# Patient Record
Sex: Female | Born: 1955
Health system: Southern US, Community
[De-identification: ages and names within clinical notes are randomized; demographics above are authoritative.]

## PROBLEM LIST (undated history)

## (undated) DIAGNOSIS — Z9889 Other specified postprocedural states: Secondary | ICD-10-CM

## (undated) DIAGNOSIS — G5600 Carpal tunnel syndrome, unspecified upper limb: Secondary | ICD-10-CM

## (undated) DIAGNOSIS — M1990 Unspecified osteoarthritis, unspecified site: Secondary | ICD-10-CM

## (undated) DIAGNOSIS — F32A Depression, unspecified: Secondary | ICD-10-CM

## (undated) DIAGNOSIS — F419 Anxiety disorder, unspecified: Secondary | ICD-10-CM

## (undated) DIAGNOSIS — R112 Nausea with vomiting, unspecified: Secondary | ICD-10-CM

## (undated) DIAGNOSIS — F329 Major depressive disorder, single episode, unspecified: Secondary | ICD-10-CM

## (undated) DIAGNOSIS — D649 Anemia, unspecified: Secondary | ICD-10-CM

## (undated) DIAGNOSIS — N189 Chronic kidney disease, unspecified: Secondary | ICD-10-CM

## (undated) DIAGNOSIS — N3281 Overactive bladder: Secondary | ICD-10-CM

## (undated) DIAGNOSIS — R05 Cough: Secondary | ICD-10-CM

## (undated) DIAGNOSIS — R053 Chronic cough: Secondary | ICD-10-CM

## (undated) HISTORY — DX: Anxiety disorder, unspecified: F41.9

## (undated) HISTORY — DX: Unspecified osteoarthritis, unspecified site: M19.90

## (undated) HISTORY — PX: OTHER SURGICAL HISTORY: SHX169

## (undated) HISTORY — DX: Cough: R05

## (undated) HISTORY — DX: Carpal tunnel syndrome, unspecified upper limb: G56.00

## (undated) HISTORY — DX: Overactive bladder: N32.81

## (undated) HISTORY — PX: JOINT REPLACEMENT: SHX530

## (undated) HISTORY — DX: Chronic cough: R05.3

---

## 1998-11-30 ENCOUNTER — Encounter: Admission: RE | Admit: 1998-11-30 | Discharge: 1999-01-25 | Payer: Self-pay | Admitting: Family Medicine

## 1999-02-16 ENCOUNTER — Encounter: Payer: Self-pay | Admitting: Neurosurgery

## 1999-02-16 ENCOUNTER — Ambulatory Visit (HOSPITAL_COMMUNITY): Admission: RE | Admit: 1999-02-16 | Discharge: 1999-02-16 | Payer: Self-pay | Admitting: Neurosurgery

## 1999-06-17 ENCOUNTER — Ambulatory Visit (HOSPITAL_COMMUNITY): Admission: RE | Admit: 1999-06-17 | Discharge: 1999-06-17 | Payer: Self-pay | Admitting: Family Medicine

## 1999-06-17 ENCOUNTER — Encounter: Payer: Self-pay | Admitting: Family Medicine

## 2000-01-30 ENCOUNTER — Encounter: Admission: RE | Admit: 2000-01-30 | Discharge: 2000-01-30 | Payer: Self-pay | Admitting: Obstetrics and Gynecology

## 2000-01-30 ENCOUNTER — Encounter: Payer: Self-pay | Admitting: Obstetrics and Gynecology

## 2001-01-31 ENCOUNTER — Encounter: Payer: Self-pay | Admitting: Obstetrics and Gynecology

## 2001-01-31 ENCOUNTER — Encounter: Admission: RE | Admit: 2001-01-31 | Discharge: 2001-01-31 | Payer: Self-pay | Admitting: Obstetrics and Gynecology

## 2002-02-17 ENCOUNTER — Encounter: Payer: Self-pay | Admitting: Emergency Medicine

## 2002-02-17 ENCOUNTER — Emergency Department (HOSPITAL_COMMUNITY): Admission: EM | Admit: 2002-02-17 | Discharge: 2002-02-17 | Payer: Self-pay | Admitting: Emergency Medicine

## 2002-03-28 ENCOUNTER — Encounter: Payer: Self-pay | Admitting: Obstetrics and Gynecology

## 2002-03-28 ENCOUNTER — Encounter: Admission: RE | Admit: 2002-03-28 | Discharge: 2002-03-28 | Payer: Self-pay | Admitting: Obstetrics and Gynecology

## 2006-01-23 ENCOUNTER — Emergency Department (HOSPITAL_COMMUNITY): Admission: EM | Admit: 2006-01-23 | Discharge: 2006-01-23 | Payer: Self-pay | Admitting: Family Medicine

## 2006-11-26 ENCOUNTER — Encounter: Admission: RE | Admit: 2006-11-26 | Discharge: 2006-11-26 | Payer: Self-pay | Admitting: Surgery

## 2007-05-08 ENCOUNTER — Encounter: Admission: RE | Admit: 2007-05-08 | Discharge: 2007-05-08 | Payer: Self-pay | Admitting: Surgery

## 2007-11-13 ENCOUNTER — Encounter: Admission: RE | Admit: 2007-11-13 | Discharge: 2007-11-13 | Payer: Self-pay | Admitting: Obstetrics and Gynecology

## 2008-11-13 ENCOUNTER — Encounter: Admission: RE | Admit: 2008-11-13 | Discharge: 2008-11-13 | Payer: Self-pay | Admitting: Anesthesiology

## 2009-12-01 ENCOUNTER — Emergency Department (HOSPITAL_COMMUNITY): Admission: EM | Admit: 2009-12-01 | Discharge: 2009-12-01 | Payer: Self-pay | Admitting: Family Medicine

## 2010-01-16 ENCOUNTER — Emergency Department (HOSPITAL_COMMUNITY): Admission: EM | Admit: 2010-01-16 | Discharge: 2010-01-16 | Payer: Self-pay | Admitting: Family Medicine

## 2010-01-24 ENCOUNTER — Encounter: Admission: RE | Admit: 2010-01-24 | Discharge: 2010-01-24 | Payer: Self-pay | Admitting: Family Medicine

## 2010-02-02 ENCOUNTER — Encounter: Admission: RE | Admit: 2010-02-02 | Discharge: 2010-02-02 | Payer: Self-pay | Admitting: Family Medicine

## 2010-12-18 ENCOUNTER — Encounter: Payer: Self-pay | Admitting: Family Medicine

## 2011-01-27 ENCOUNTER — Other Ambulatory Visit: Payer: Self-pay | Admitting: Obstetrics and Gynecology

## 2011-01-27 DIAGNOSIS — Z1231 Encounter for screening mammogram for malignant neoplasm of breast: Secondary | ICD-10-CM

## 2011-02-08 ENCOUNTER — Ambulatory Visit
Admission: RE | Admit: 2011-02-08 | Discharge: 2011-02-08 | Disposition: A | Discharge status: Home / Self Care | Payer: Federal, State, Local not specified - PPO | Source: Ambulatory Visit | Attending: Obstetrics and Gynecology | Admitting: Obstetrics and Gynecology

## 2011-02-08 DIAGNOSIS — Z1231 Encounter for screening mammogram for malignant neoplasm of breast: Secondary | ICD-10-CM

## 2011-02-16 ENCOUNTER — Other Ambulatory Visit: Payer: Self-pay | Admitting: Specialist

## 2011-02-16 ENCOUNTER — Encounter (HOSPITAL_COMMUNITY): Payer: Federal, State, Local not specified - PPO | Attending: Specialist

## 2011-02-16 DIAGNOSIS — Z79899 Other long term (current) drug therapy: Secondary | ICD-10-CM | POA: Insufficient documentation

## 2011-02-16 DIAGNOSIS — Z01812 Encounter for preprocedural laboratory examination: Secondary | ICD-10-CM | POA: Insufficient documentation

## 2011-02-16 LAB — URINALYSIS, ROUTINE W REFLEX MICROSCOPIC: Ketones, ur: NEGATIVE mg/dL

## 2011-02-16 LAB — DIFFERENTIAL
Basophils Absolute: 0 10*3/uL (ref 0.0–0.1)
Eosinophils Relative: 1 % (ref 0–5)
Monocytes Relative: 6 % (ref 3–12)
Neutrophils Relative %: 62 % (ref 43–77)

## 2011-02-16 LAB — CBC
HCT: 41.2 % (ref 36.0–46.0)
MCHC: 33 g/dL (ref 30.0–36.0)
MCV: 94.7 fL (ref 78.0–100.0)
RDW: 14 % (ref 11.5–15.5)
WBC: 8 10*3/uL (ref 4.0–10.5)

## 2011-02-16 LAB — COMPREHENSIVE METABOLIC PANEL
ALT: 18 U/L (ref 0–35)
Alkaline Phosphatase: 55 U/L (ref 39–117)
CO2: 28 mEq/L (ref 19–32)
Calcium: 9.4 mg/dL (ref 8.4–10.5)
Chloride: 104 mEq/L (ref 96–112)
GFR calc Af Amer: >60 mL/min (ref >60)
Glucose, Bld: 89 mg/dL (ref 70–99)
Total Bilirubin: 0.8 mg/dL (ref 0.3–1.2)
Total Protein: 7.1 g/dL (ref 6.0–8.3)

## 2011-02-16 LAB — APTT: aPTT: 46 seconds — ABNORMAL HIGH (ref 24–37)

## 2011-02-16 LAB — PROTIME-INR
INR: 0.94 (ref 0.00–1.49)
Prothrombin Time: 12.8 seconds (ref 11.6–15.2)

## 2011-02-16 LAB — SURGICAL PCR SCREEN
MRSA, PCR: NEGATIVE
Staphylococcus aureus: POSITIVE — AB

## 2011-02-28 ENCOUNTER — Inpatient Hospital Stay (HOSPITAL_COMMUNITY)
Admission: RE | Admit: 2011-02-28 | Discharge: 2011-03-05 | DRG: 209 | Disposition: A | Discharge status: Home Health | Payer: Federal, State, Local not specified - PPO | Source: Ambulatory Visit | Attending: Specialist | Admitting: Specialist

## 2011-02-28 DIAGNOSIS — R509 Fever, unspecified: Secondary | ICD-10-CM | POA: Diagnosis not present

## 2011-02-28 DIAGNOSIS — B029 Zoster without complications: Secondary | ICD-10-CM | POA: Diagnosis present

## 2011-02-28 DIAGNOSIS — N951 Menopausal and female climacteric states: Secondary | ICD-10-CM | POA: Diagnosis present

## 2011-02-28 DIAGNOSIS — D649 Anemia, unspecified: Secondary | ICD-10-CM | POA: Diagnosis present

## 2011-02-28 DIAGNOSIS — M21169 Varus deformity, not elsewhere classified, unspecified knee: Secondary | ICD-10-CM | POA: Diagnosis present

## 2011-02-28 DIAGNOSIS — M1710 Unilateral primary osteoarthritis, unspecified knee: Principal | ICD-10-CM | POA: Diagnosis present

## 2011-02-28 DIAGNOSIS — F411 Generalized anxiety disorder: Secondary | ICD-10-CM | POA: Diagnosis present

## 2011-02-28 DIAGNOSIS — R071 Chest pain on breathing: Secondary | ICD-10-CM | POA: Diagnosis not present

## 2011-02-28 DIAGNOSIS — R404 Transient alteration of awareness: Secondary | ICD-10-CM | POA: Diagnosis not present

## 2011-02-28 DIAGNOSIS — Z79899 Other long term (current) drug therapy: Secondary | ICD-10-CM

## 2011-02-28 LAB — ABO/RH: ABO/RH(D): AB POS

## 2011-03-01 LAB — CBC
RBC: 4.03 MIL/uL (ref 3.87–5.11)
RDW: 14.3 % (ref 11.5–15.5)

## 2011-03-01 LAB — BASIC METABOLIC PANEL
Chloride: 103 mEq/L (ref 96–112)
GFR calc Af Amer: >60 mL/min (ref >60)
GFR calc non Af Amer: >60 mL/min (ref >60)
Potassium: 4.8 mEq/L (ref 3.5–5.1)
Sodium: 136 mEq/L (ref 135–145)

## 2011-03-02 LAB — BASIC METABOLIC PANEL
Chloride: 104 mEq/L (ref 96–112)
Creatinine, Ser: 0.79 mg/dL (ref 0.4–1.2)
GFR calc non Af Amer: >60 mL/min (ref >60)
Potassium: 4.2 mEq/L (ref 3.5–5.1)
Sodium: 136 mEq/L (ref 135–145)

## 2011-03-02 LAB — CBC
MCHC: 32.1 g/dL (ref 30.0–36.0)
RDW: 14.2 % (ref 11.5–15.5)

## 2011-03-03 ENCOUNTER — Inpatient Hospital Stay (HOSPITAL_COMMUNITY): Payer: Federal, State, Local not specified - PPO

## 2011-03-03 LAB — HEMOGLOBIN AND HEMATOCRIT, BLOOD: Hemoglobin: 10.5 g/dL — ABNORMAL LOW (ref 12.0–15.0)

## 2011-03-03 LAB — URINALYSIS, ROUTINE W REFLEX MICROSCOPIC
Leukocytes, UA: NEGATIVE
Nitrite: NEGATIVE
Specific Gravity, Urine: 1.017 (ref 1.005–1.030)
Urobilinogen, UA: 1 mg/dL (ref 0.0–1.0)

## 2011-03-03 LAB — CROSSMATCH
Antibody Screen: NEGATIVE
Unit division: 0
Unit division: 0

## 2011-03-03 LAB — CBC
HCT: 33.5 % — ABNORMAL LOW (ref 36.0–46.0)
MCHC: 33.7 g/dL (ref 30.0–36.0)
RDW: 14.4 % (ref 11.5–15.5)
WBC: 9.9 10*3/uL (ref 4.0–10.5)

## 2011-03-03 MED ORDER — IOHEXOL 300 MG/ML  SOLN
100.0000 mL | Freq: Once | INTRAMUSCULAR | Status: AC | PRN
  Administered 2011-03-03: 100 mL via INTRAVENOUS

## 2011-03-04 LAB — URINE CULTURE
Colony Count: 8000
Culture  Setup Time: 201204061512
Special Requests: NEGATIVE

## 2011-03-23 NOTE — Op Note (Signed)
Alison Long, Alison Long                 ACCOUNT NO.:  [id number]  MEDICAL RECORD NO.:  [id number]           PATIENT TYPE:  I  LOCATION:  1613                         FACILITY:  [facility]  PHYSICIAN:  Erasmo Leventhal, M.D.DATE OF BIRTH:  [birth year]  DATE OF PROCEDURE:  02/28/2011 DATE OF DISCHARGE:                              OPERATIVE REPORT   PREOPERATIVE DIAGNOSIS:  Right knee end-stage osteoarthritis.  POSTOPERATIVE DIAGNOSIS:  Right knee end-stage osteoarthritis.  PROCEDURE:  Right total-knee arthroplasty.  SURGEON:  Erasmo Leventhal, MD  ASSISTANT:  Jamelle Rushing, PA  ANESTHESIA:  Spinal followed by general.  ESTIMATED BLOOD LOSS:  Less than 50 cc.  DRAINS:  One Hemovac.  COMPLICATIONS:  None.  TOURNIQUET TIME:  70 minutes at 250 mmHg.  COMPLICATIONS:  None.  DISPOSITION:  PACU stable.  OPERATIVE IMPLANTS:  DePuy [employer], size 3 femur, size 2.5 tibia, 10-mm posterior stabilized rotating platform tibial insert, and a 30-mm patella, all cemented.  OPERATIVE DETAILS:  The patient was counseled in the holding area and the correct side was identified.  Taken to the operating room.  IV antibiotics were given.  Spinal was administered.  Foley catheter was placed utilizing sterile technique by the OR circulating nurse.  All extremities were well-padded and bumped.  The right lower extremity was elevated.  Found to have 5-degree flexion contracture.  She could flex to 115 degrees.  She was elevated, prepped with DuraPrep and draped in the sterile fashion, exsanguinated with an Esmarch.  Tourniquet was inflated to 250 mmHg.  A straight midline incision was made through the skin and subcutaneous tissue.  Medial soft tissue flaps were developed to the appropriate level.  Medial parapatellar arthrotomy was performed with proximal medial soft tissue release due to her slight varus.  The knee was then flexed.  End-stage arthritis changes in all 3  compartments with bone-against-bone.  Cruciate ligaments were resected.  Starter hole was made in the distal femur.  Canal was irrigated until the effluent was clear.  Intramedullary rod was gently placed.  I chose a 5-degrees valgus cut and a 10-mm cut off of the distal femur due to her flexion contracture.  Distal femur was found to be a size 3.  Rotation cover was set and the cutting block was applied for a size 3.  Collateral ligaments were protected.  Tibia was subluxed anteriorly.  Extramedullary alignment was utilized on the tibia.  We took a 10-mm, 2-mm cut off of the tibia on the most deficient side on the medial side at a 0-degree slope. Posteromedial and posterofemoral osteophytes were removed under direct visualization for flexion/extension blocks __________ were well-balanced flexion/extension gaps.  The knee was then flexed.  Tibia was subluxed anteriorly.  Found to be a 2.5 rotation __________ reamer punch was performed.  Femoral box cut, this time was size 2.5 tibia, 3 femur, 10 insert, well-balanced with varus and valgus stress at 0, 30, 60 and 90 degrees of flexion, full extension.  Flexion to 120 limited by the drapes.  Patella was found to be a size 38.  Appropriate amount of bone  was resected.  Lock holes were made.  Patella tracked anatomically.  The knee was then flexed.  All trials were removed and irrigated with pulsatile lavage.  Utilizing modern cement technique, all components were cemented in place, size 2.5 tibia, 3 femur, 10 insert, 10 trial insert and a 38-mm patella were all cemented __________ allowed cement to cure.  Full extension with compression, excess cement was removed and with the 10 insert was well-balanced in  flexion and extension gaps, again throughout full  range of motion.  Tibia was subluxed anteriorly. Excess cement was removed.  Posteriorly, it was irrigated and  as the knee was with pulsatile lavage again.  At this point, Gelfoam was  placed posteriorly and a 10 mm posterior stabilized rotating platform tibial insert was implanted.  The knee was then examined.  She had full extension, flexion 115 to 120 limited by the drapes.  She was stable to valgus and varus stress at 0, 30, 60, and 90 degrees of flexion.  Flexion and extension gaps were balanced and the patella tracked anatomically and we had an excellent alignment.  A medium Hemovac drain was placed.  Sequential closure of layers was done.  The arthrotomy was closed at 90 degrees of flexion with Vicryl suture figure-of-8 fashion, subcutaneous with __________ Vicryl suture.  Steri-Strips were applied.  Drains hooked to suction. Sterile compressive dressing was applied.  Tourniquet was deflated. Drains hooked to suction.  Normal circulation of the foot and ankle  at the end of the case.  The patient tolerated the procedure well with no complications or problems.  She was awakened and taken out of the operating room to the PACU in stable condition.  To help with surgical technique and decision-making was Oneida Alar, PA-C, assistance was needed.          ______________________________ Erasmo Leventhal, M.D.     RAC/MEDQ  D:  02/28/2011  T:  03/01/2011  Job:  [id number]  Electronically Signed by Eugenia Mcalpine M.D. on 03/23/2011 09:27:09 AM

## 2011-04-26 NOTE — H&P (Signed)
NAME:  Alison Long, Alison Long                 ACCOUNT NO.:  [id number]  MEDICAL RECORD NO.:  [id number]           PATIENT TYPE:  I  LOCATION:  1S                           [facility]ACILITY[facility]  [facility]  PHYSICIAN:  Erasmo Leventhal, M.D.DATE OF BIRTH:  June 07, 1956  DATE OF ADMISSION: DATE OF DISCHARGE:                             HISTORY & PHYSICAL   ANTICIPATED DATE OF ADMISSION:  February 28, 2011.  CHIEF COMPLAINT:  Painful range of motion of bilateral knees, right greater than left.  HISTORY OF PRESENT ILLNESS:  The patient is a 55 year old female, well known to Dr. Thomasena Edis, for evaluation of bilateral arthritic changes. The patient has failed conservative treatment to include Hyalgan injections.  Continues have pain with motion and weightbearing.  The patient has elected to proceed with a total knee arthroplasty.  The patient's right knee x-ray shows she has bilateral knee varus deformities with bone-on-bone medial compartments with significant patellofemoral changes.  ALLERGIES:  SULFA.  PRIMARY CARE PHYSICIAN:  Holley Bouche, M.D.  OB-GYN:  Hal Morales, M.D.  MEDICATIONS: 1. Sertraline 50 mg a day. 2. Enablex 7.5 mg a day. 3. Folivane Plus once a day. 4. Meloxicam 15 mg a day. 5. Vagifem 2 times a week. 6. Hydrocodone 7.5 two tablets every 4-6 hours when needed. 7. Tylenol Arthritis as needed. 8. Glucosamine chondroitin. 9. Calcium plus vitamin D. 10.Vitamin D. 11.Fish oil. 12.B12.  PRESENT MEDICAL HISTORY: 1. Anxiety. 2. Postmenopausal issues. 3. History of shingles across abdomen. 4. History of chronic anemia, currently on iron. 5. Arthritis, bilateral knees.  REVIEW OF SYSTEMS:  NEUROLOGIC:  Negative for any strokes, seizures, convulsions, alcohol or drug problems, or depression.  PULMONARY: Negative for any asthma, bronchitis, pneumonia, COPD, sleep apnea, or tuberculosis.  CARDIOVASCULAR:  She denies any hypertension issues, heart disease, cholesterol, irregular  heart rhythms shortness of breath, or chest pains.  GI:  She denies any reflux, ulcers, hepatitis, liver issues, gallbladder issues, or colon issues.  GU:  She has had urinary tract recently in March and she is currently getting over that.  No other chronic issues related to incontinence, kidney disease or failure, or kidney stones.  ENDOCRINE.  She denies any thyroid or diabetic problems.  HEMATOLOGIC.  She does have chronic anemia.  She is on iron. She does bruise easily.  No history of blood clots or any cancers.  PAST SURGICAL HISTORY:  Two knee surgeries by Dr. Thomasena Edis without any complications.  FAMILY MEDICAL HISTORY:  Father is deceased from cancer at the age 56. Mother is deceased from breast cancer at the age of 58.  One sister with history of melanoma.  SOCIAL HISTORY:  She is married.  She is a case advocate.  Never a smoker.  No alcohol.  She has 2 grown children.  She lives in a 2-story single family home.  PHYSICAL EXAMINATION:  VITAL SIGNS:  Height is 5 feet 3 inches, weight is 170, blood pressure is 138/80, pulse is 80 and regular, respirations 12, and patient is afebrile. GENERAL:  This is a healthy-appearing well-developed female, conscious, alert, and appropriate and appears to be strong, appears to  be in no distress. HEENT:  Head is normocephalic.  Gross hearing is intact. NECK:  Supple.  No palpable lymphadenopathy.  Good motion. CHEST:  Lung sounds were clear and equal. HEART:  Regular rate and rhythm. ABDOMEN:  Soft.  Bowel sounds present. EXTREMITIES:  Upper extremities had good range of motion.  Good motor strength in all joints.  Lower extremities, she had good range of motion both hips.  Both knees, she very gingerly extended to about 5 degrees short of full extension.  She was able to flex them back to about 115. She was very tender medial joints.  No effusion.  Calves were soft and nontender.  No edema. PERIPHERAL VASCULAR:  Carotid pulses were 2+,  no bruits.  Radial pulses were 2+.  Posterior tibial pulses were 2+.  No lower extremity edema, venostasis, or pigmentation changes. BREAST, RECTAL AND GU:  Deferred at this time.  IMPRESSION: 1. End-stage osteoarthritis bilateral knees, right more symptomatic     than left. 2. Anxiety. 3. History of shingles. 4. History of chronic anemia.  PLAN:  The patient will undergo all routine labs and tests prior to having a right total knee arthroplasty by Dr. Thomasena Edis at [facility] on February 28, 2011.  The patient's medical history is reviewed. Was encouraged all questions and they were answered.     Jamelle Rushing, P.A.   ______________________________ Erasmo Leventhal, M.D.    RWK/MEDQ  D:  02/20/2011  T:  02/20/2011  Job:  [id number]  cc:   Erasmo Leventhal, M.D. Fax: [phone]  Electronically Signed by Arlyn Leak P.A. on 03/31/2011 09:56:38 AM Electronically Signed by Eugenia Mcalpine M.D. on 04/26/2011 01:03:19 PM

## 2011-07-10 NOTE — Discharge Summary (Signed)
NAMECOLLEN, HOSTLER NO.:  [id number]  MEDICAL RECORD NO.:  [id number]  LOCATION:  1613                         FACILITY:  [facility]  PHYSICIAN:  Erasmo Leventhal, M.D.DATE OF BIRTH:  [birth year]  DATE OF ADMISSION:  02/28/2011 DATE OF DISCHARGE:  03/05/2011                              DISCHARGE SUMMARY   DISPOSITION:  To home.  ADMISSION DIAGNOSES: 1. End-stage osteoarthritis of the right knee. 2. Anxiety. 3. History of shingles. 4. History of chronic anemia.  HISTORY OF PRESENT ILLNESS:  The patient is a 55 year old female well- known to Dr. Thomasena Edis evaluated for bilateral arthritic knees.  The patient failed conservative treatment including injections, physical therapy.  She continued to have pain with weightbearing and range of motion.  X-rays revealed she has bilateral knee varus deformities with bone-on-bone medial compartments with significant patellofemoral changes.  The patient elected to proceed with a total knee arthroplasty on the right.  ALLERGIES:  SULFA.  PRIMARY CARE PHYSICIAN:  Holley Bouche, MD.  MEDICATIONS ON ADMISSION:  Sertraline, Enablex, Folivane-Plus, meloxicam, Vagifem, hydrocodone, Tylenol Arthritis, glucosamine, calcium, vitamin D, fish oil, vitamin B12.  SURGICAL PROCEDURES:  On February 28, 2011, the patient was admitted to [facility] under the care of Dr. Valma Cava.  The patient was taken to the OR by Dr. Valma Cava, assisted by Arlyn Leak PA-C under spinal anesthesia with general backup.  The patient underwent a right total knee arthroplasty with DePuy rotating platform system.  One Hemovac drain was left in place with no complications.  Tourniquet was 70 minutes.  Estimated blood loss was minimal.  The patient was returned to the recovery room in good condition, to follow total knee protocol. The patient had the following components implanted:  A size 3 Sigma femur, size 2.5 tibia, 10-mm thickness  polyethylene bearing 2.5 size and a 30 mm patella, all components were implanted with polymethylmethacrylate.  CONSULTS:  Following routine consults requested; physical therapy, case management, pharmacy.  HOSPITAL COURSE:  On February 28, 2011, the patient admitted to [facility] under the care of Dr. Thomasena Edis.  The patient was taken to the OR where a right total knee arthroplasty was performed under spinal and general anesthesia.  The patient tolerated the procedure well.  There were no complications.  The patient was transferred to recovery room and then to orthopedic floor in good condition following total knee protocol with IV antibiotics, pain medicines, and Lovenox for DVT prophylaxis. The patient then occurred 4 postop days on the floor, in which the patient was able to wean off her IV pain medicines well and maintain her pain control well with p.o. meds.  Her wound remained benign for any signs of infection.  Her leg remained neuromotor vascularly intact.  The patient did develop some low-grade temps.  She was having some temps with slightly elevated heart rate.  She had no focal source of infection such as respiratory, GI, GU, or wound.  She was also having little bit of desatting of her oxygen saturation, so a CT scan was performed to rule out PE.  She did have a little bit of atelectasis but she had no obvious PE  on the CT scan.  The patient was encouraged pulmonary toileting, encouraged out of bed with physical therapy.  The patient did improve and the patient was discharged home on March 05, 2011, for outpatient followup.  LABORATORY DATA:  CT scan done on March 03, 2011, shows patchy infiltrates of left lower lobe with bibasilar atelectasis, no focal pulmonary emboli.  CBC on March 03, 2011, WBC 9.9, hemoglobin 11.3, hematocrit 33.5, platelets 221.  Routine chemistries on April 5 showed sodium of 136, potassium of 4.2, glucose 132, BUN 3, creatinine 0.79. Estimated  GFR greater than 60.  Urinalysis on April 6 was unremarkable. She did have some granular and hyaline casts and amorphous urate phosphate crystals.  Urine culture was 8000 colonies indicating insignificant growth.  DISCHARGE INSTRUCTIONS:  The patient is to keep her wound clean and dry. She is to change her dressing on a daily basis and monitor for infection.  ACTIVITY:  The patient is weightbear as tolerated with use of walker. The patient is to increase her activity as instructed per Physical Therapy.  DIET:  No restrictions.  FOLLOWUP:  She needs a followup appointment with Dr. Thomasena Edis 2 weeks from date of discharge.  The patient is to call [phone] for that followup appointment.  MEDICATIONS: 1. Vagifem 1 pill twice a day. 2. Vitamin B12 1000 mcg 1 or 2 tablets daily. 3. Fish oil 1200 mg daily. 4. Vitamin D3 2000 units 1 tablet daily. 5. Calcium 500 plus D 1 tablet daily. 6. Glucosamine 2 tablets daily. 7. Meloxicam, on hold. 8. Hydrocodone, on hold. 9. Tylenol Arthritis, on hold until off of postoperative pain meds. 10.Flovine-Plus 1 tablet a day. 11.Enablex 7.5 one tablet a day. 12.Sertraline 50 mg 1 tablet at bedtime. 13.Percocet 1 or 2 tablets every 4 to 6 hours for pain if needed. 14.Robaxin 500 mg 1 tablet every 6 hours for muscle spasms if needed. 15.Lovenox 30 mg subcu injections, 1 injection every 12 hours for a     total of 14 days.  CONDITION ON DISCHARGE:  The patient's condition upon discharge to home looks as improved and good.     Jamelle Rushing, P.A.   ______________________________ Erasmo Leventhal, M.D.    RWK/MEDQ  D:  06/20/2011  T:  06/20/2011  Job:  [id number]  cc:   Erasmo Leventhal, M.D. Fax: [phone]  Holley Bouche, M.D. Fax: [phone]  Electronically Signed by Arlyn Leak P.A. on 06/21/2011 07:15:54 AM Electronically Signed by Eugenia Mcalpine M.D. on 07/10/2011 03:38:07 PM

## 2011-10-25 NOTE — H&P (Signed)
NAME:  Alison Long, Alison Long NO.:  [id number]  MEDICAL RECORD NO.:  [id number]  LOCATION:  PERIO                        FACILITY:  [facility]  PHYSICIAN:  Erasmo Leventhal, M.D.DATE OF BIRTH:  1956/02/01  DATE OF ADMISSION:  11/10/2011 DATE OF DISCHARGE:                             HISTORY & PHYSICAL   CHIEF COMPLAINT:  Painful range of motion and weightbearing, left knee.  HISTORY OF PRESENT ILLNESS:  The patient is a 55 year old female, well known to Dr. Thomasena Edis, for evaluation and treatment of bilateral knee pain.  The patient was found to have significant arthritic issues in her left knee.  She has failed conservative treatment.  The patient has had a very successful right total knee arthroplasty and the patient elected to proceed with a left total knee arthroplasty due to the amount of her pain.  The patient has been cleared by her primary care physician Dr. Holley Bouche for this issue.  ALLERGIES:  SULFA causing hives.  PRIMARY CARE PHYSICIAN:  Holley Bouche, M.D.  MEDICATIONS: 1. Integra plus once a day. 2. Sertraline 50 mg once a day. 3. Enablex once a day. 4. Glucosamine and chondroitin 2 tablets a day. 5. Fish oil 1200 mg twice a day. 6. Calcium 600 plus vitamin D 2 tablets a day. 7. Vitamin D3 1000 international units a day. 8. Vitamin B12 500 mg 2 tablets a day. 9. Aspirin 81 mg a day. 10.Vagifem 10 mcg 2 times a week. 11.Voltaren gel as needed. 12.Tylenol Arthritis as needed. 13.Norco as needed.  PAST MEDICAL HISTORY:  Includes: 1. History of shingles. 2. Urinary incontinence.  REVIEW OF SYSTEMS:  NEUROLOGIC:  She has had shingles across her abdomen in the past.  She denies any strokes, seizures, convulsions, memory issues, visual issues, any unusual numbness or tingling.  PULMONARY: She denies any shortness of breath, productive cough.  No wheezes, no history of frequent pneumonias, no history of COPD, sleep apnea.  She denies any  problems of testing for tuberculosis.  CARDIOVASCULAR:  She denies any history of previous stress test.  She denies any feeling her heart is beating too fast, too slow, skipping beats, periods of syncope, periods of shortness of breath or any chest discomfort.  GI:  She denies any histories of unusual irritation with reflux disease, heartburn, indigestion, unusual constipation, diarrhea, no abdominal fullness.  No abdominal discomfort.  No history of hepatitis, no problems with diverticulitis in the past.  GU:  She does have a little bit of urinary incontinence, she says, with a dropped bladder, but she denies any problems with frequent UTIs, failure to urinate, cystitis or kidney stones.  ENDOCRINE:  She denies any history of thyroid or diabetic issues.  No problems with increased urination, difficulty with urination.  No cold or heat intolerances.  PAST SURGICAL HISTORY:  Includes: 1. Knee arthroscopies in 2009, right and the left. 2. A right total knee arthroplasty in 2012 with just some problems     with anesthesia and nausea with knee arthroscopies only.  FAMILY MEDICAL HISTORY:  Father is deceased at 51 from a melanoma. Mother had died from breast cancer at the age of 25.  SOCIAL HISTORY:  The patient is married.  She works as a case advocate. She does not smoke or use alcohol.  She has got 2 grown children.  She lives in a 2 story home and she will have her family provide care for her postop.  PHYSICAL EXAMINATION:  VITAL SIGNS:  Height is 5 feet 3 inches, weight is 165.  Blood pressure is 128/90, pulse of 80 and regular. Respirations 12, nonlabored. GENERAL:  This is a healthy-appearing, well-developed female, conscious, alert, appropriate, appears to be a good historian, appears to be in no distress. NECK:  Supple.  No palpable lymphadenopathy.  Good range of motion. CHEST:  Lung sounds are clear throughout.  No wheezings. HEART:  Regular rate and rhythm. ABDOMEN:  Soft.   Bowel sounds present. EXTREMITIES:  Upper extremities have good range of motion.  Good motor strength.  Lower extremities, right knee had a well-healed midline surgical incision.  She is able to fully extend, she flex it back to 120.  She has no instability.  Calf is soft and nontender.  No signs of phlebitis or edema.  Good motion of the right ankle.  Her left knee, she did have crepitus in the knee with full extension and flexion back to 120 with discomfort.  No ligament instability.  Calf is soft and nontender.  No signs of phlebitis or edema.  Good pulses in the ankle. PERIPHERAL VASCULAR:  She had no carotid bruits.  She had good carotid pulses.  Good radial pulses.  Good posterior tibial pulses.  No lower extremity edema or venous stasis changes. BREAST, RECTAL AND GU:  Exams were deferred at this time.  IMPRESSION: 1. End-stage osteoarthritis, left knee. 2. Recent right total knee arthroplasty. 3. History of shingles. 4. History of urinary incontinence.  DISPOSITION:  At this time, after viewing the patient's findings, the patient elected to proceed with a left total knee arthroplasty at [facility] under the care of Dr. Thomasena Edis on November 10, 2011.  The patient's medical history was reviewed.  She has been cleared by her primary care physician for this upcoming surgical procedure.  She will undergo all routine labs and tests prior to.     Jamelle Rushing, P.A.   ______________________________ Erasmo Leventhal, M.D.    RWK/MEDQ  D:  10/25/2011  T:  10/25/2011  Job:  [id number]  cc:   Erasmo Leventhal, M.D. Fax: [phone]

## 2011-11-03 ENCOUNTER — Encounter (HOSPITAL_COMMUNITY): Payer: Self-pay | Admitting: Pharmacy Technician

## 2011-11-06 ENCOUNTER — Encounter (HOSPITAL_COMMUNITY)
Admission: RE | Admit: 2011-11-06 | Discharge: 2011-11-06 | Disposition: A | Discharge status: Home / Self Care | Payer: Federal, State, Local not specified - PPO | Source: Ambulatory Visit | Attending: Specialist | Admitting: Specialist

## 2011-11-06 ENCOUNTER — Encounter (HOSPITAL_COMMUNITY): Payer: Self-pay

## 2011-11-06 ENCOUNTER — Other Ambulatory Visit: Payer: Self-pay | Admitting: Pain Medicine

## 2011-11-06 HISTORY — DX: Anemia, unspecified: D64.9

## 2011-11-06 HISTORY — DX: Other specified postprocedural states: Z98.890

## 2011-11-06 HISTORY — DX: Chronic kidney disease, unspecified: N18.9

## 2011-11-06 HISTORY — DX: Other specified postprocedural states: R11.2

## 2011-11-06 HISTORY — DX: Major depressive disorder, single episode, unspecified: F32.9

## 2011-11-06 HISTORY — DX: Unspecified osteoarthritis, unspecified site: M19.90

## 2011-11-06 HISTORY — DX: Depression, unspecified: F32.A

## 2011-11-06 LAB — COMPREHENSIVE METABOLIC PANEL
BUN: 12 mg/dL (ref 6–23)
Calcium: 10.1 mg/dL (ref 8.4–10.5)
GFR calc Af Amer: >90 mL/min (ref >90)
GFR calc non Af Amer: >90 mL/min (ref >90)
Glucose, Bld: 88 mg/dL (ref 70–99)
Total Protein: 7.6 g/dL (ref 6.0–8.3)

## 2011-11-06 LAB — PROTIME-INR
INR: 0.92 (ref 0.00–1.49)
Prothrombin Time: 12.6 seconds (ref 11.6–15.2)

## 2011-11-06 LAB — CBC
HCT: 40.5 % (ref 36.0–46.0)
Hemoglobin: 13.6 g/dL (ref 12.0–15.0)
MCH: 30.8 pg (ref 26.0–34.0)
MCHC: 33.6 g/dL (ref 30.0–36.0)
MCV: 91.8 fL (ref 78.0–100.0)

## 2011-11-06 LAB — URINALYSIS, ROUTINE W REFLEX MICROSCOPIC
Bilirubin Urine: NEGATIVE
Nitrite: NEGATIVE
Protein, ur: NEGATIVE mg/dL
Specific Gravity, Urine: 1.019 (ref 1.005–1.030)
Urobilinogen, UA: 0.2 mg/dL (ref 0.0–1.0)

## 2011-11-06 LAB — DIFFERENTIAL
Basophils Absolute: 0 10*3/uL (ref 0.0–0.1)
Eosinophils Absolute: 0.1 10*3/uL (ref 0.0–0.7)
Lymphocytes Relative: 33 % (ref 12–46)
Lymphs Abs: 2.3 10*3/uL (ref 0.7–4.0)
Neutrophils Relative %: 58 % (ref 43–77)

## 2011-11-06 LAB — SURGICAL PCR SCREEN: MRSA, PCR: NEGATIVE

## 2011-11-06 MED ORDER — CHLORHEXIDINE GLUCONATE 4 % EX LIQD
60.0000 mL | Freq: Once | CUTANEOUS | Status: DC
  Filled 2011-11-06: qty 60

## 2011-11-06 NOTE — Patient Instructions (Signed)
Alison Long  11/06/2011   Your procedure is scheduled on: 11/10/11 [id number] am   Report to [facility] at 0530 AM.  Call this number if you have problems the morning of surgery: [phone]   Remember:   Do not eat food:After Midnight.  May have clear liquids:until Midnight .  Clear liquids include soda, tea, black coffee, apple or grape juice, broth.  Take these medicines the morning of surgery with A SIP OF WATER:    Do not wear jewelry, make-up or nail polish.  Do not wear lotions, powders, or perfumes.   Do not shave 48 hours prior to surgery.  Do not bring valuables to the hospital.  Contacts, dentures or bridgework may not be worn into surgery.  Leave suitcase in the car. After surgery it may be brought to your room.  For patients admitted to the hospital, checkout time is 11:00 AM the day of discharge.     Special Instructions: CHG Shower Use Special Wash: 1/2 bottle night before surgery and 1/2 bottle morning of surgery. shower chin to toes with CHG.  Wash face and private parts with regular soap.    Please read over the following fact sheets that you were given: MRSA Information, Blood Transfusion Fact Sheet, Incentive Spirometry Fact Sheet, coughing and deep breathing exercises, leg exercises.

## 2011-11-10 ENCOUNTER — Inpatient Hospital Stay (HOSPITAL_COMMUNITY)
Admission: RE | Admit: 2011-11-10 | Discharge: 2011-11-13 | DRG: 209 | Disposition: A | Discharge status: Home Health | Payer: Federal, State, Local not specified - PPO | Source: Ambulatory Visit | Attending: Specialist | Admitting: Specialist

## 2011-11-10 ENCOUNTER — Encounter (HOSPITAL_COMMUNITY): Payer: Self-pay | Admitting: Anesthesiology

## 2011-11-10 ENCOUNTER — Inpatient Hospital Stay (HOSPITAL_COMMUNITY): Payer: Federal, State, Local not specified - PPO | Admitting: Anesthesiology

## 2011-11-10 ENCOUNTER — Encounter (HOSPITAL_COMMUNITY)
Admission: RE | Disposition: A | Discharge status: Home Health | Payer: Self-pay | Source: Ambulatory Visit | Attending: Specialist

## 2011-11-10 ENCOUNTER — Encounter (HOSPITAL_COMMUNITY): Payer: Self-pay | Admitting: *Deleted

## 2011-11-10 DIAGNOSIS — M1710 Unilateral primary osteoarthritis, unspecified knee: Principal | ICD-10-CM | POA: Diagnosis present

## 2011-11-10 DIAGNOSIS — D649 Anemia, unspecified: Secondary | ICD-10-CM | POA: Diagnosis not present

## 2011-11-10 DIAGNOSIS — Z01812 Encounter for preprocedural laboratory examination: Secondary | ICD-10-CM

## 2011-11-10 DIAGNOSIS — E871 Hypo-osmolality and hyponatremia: Secondary | ICD-10-CM | POA: Diagnosis not present

## 2011-11-10 DIAGNOSIS — Z96659 Presence of unspecified artificial knee joint: Secondary | ICD-10-CM

## 2011-11-10 HISTORY — PX: TOTAL KNEE ARTHROPLASTY: SHX125

## 2011-11-10 LAB — TYPE AND SCREEN
ABO/RH(D): AB POS
Antibody Screen: NEGATIVE

## 2011-11-10 SURGERY — ARTHROPLASTY, KNEE, TOTAL
Anesthesia: Spinal | Site: Knee | Laterality: Left | Wound class: Clean

## 2011-11-10 MED ORDER — CEFAZOLIN SODIUM 1-5 GM-% IV SOLN
1.0000 g | Freq: Four times a day (QID) | INTRAVENOUS | Status: AC
  Administered 2011-11-10 – 2011-11-11 (×3): 1 g via INTRAVENOUS
  Filled 2011-11-10 (×4): qty 50

## 2011-11-10 MED ORDER — HEMOSTATIC AGENTS (NO CHARGE) OPTIME
TOPICAL | Status: DC | PRN
  Administered 2011-11-10: 1 via TOPICAL

## 2011-11-10 MED ORDER — EPHEDRINE SULFATE 50 MG/ML IJ SOLN
INTRAMUSCULAR | Status: DC | PRN
  Administered 2011-11-10 (×5): 5 mg via INTRAVENOUS

## 2011-11-10 MED ORDER — MIDAZOLAM HCL 5 MG/5ML IJ SOLN
INTRAMUSCULAR | Status: DC | PRN
  Administered 2011-11-10 (×2): 1 mg via INTRAVENOUS

## 2011-11-10 MED ORDER — METHOCARBAMOL 500 MG PO TABS
500.0000 mg | ORAL_TABLET | Freq: Four times a day (QID) | ORAL | Status: DC | PRN
  Administered 2011-11-11 – 2011-11-12 (×4): 500 mg via ORAL
  Filled 2011-11-10 (×4): qty 1

## 2011-11-10 MED ORDER — CALCIUM CARBONATE-VITAMIN D 500-200 MG-UNIT PO TABS
1.0000 | ORAL_TABLET | Freq: Two times a day (BID) | ORAL | Status: DC
  Administered 2011-11-10 – 2011-11-13 (×6): 1 via ORAL
  Filled 2011-11-10 (×8): qty 1

## 2011-11-10 MED ORDER — SERTRALINE HCL 50 MG PO TABS
50.0000 mg | ORAL_TABLET | Freq: Every day | ORAL | Status: DC
  Administered 2011-11-10 – 2011-11-12 (×3): 50 mg via ORAL
  Filled 2011-11-10 (×4): qty 1

## 2011-11-10 MED ORDER — POTASSIUM CHLORIDE IN NACL 20-0.9 MEQ/L-% IV SOLN
INTRAVENOUS | Status: DC
  Administered 2011-11-10: 75 mL/h via INTRAVENOUS
  Administered 2011-11-11: 04:00:00 via INTRAVENOUS
  Filled 2011-11-10 (×4): qty 1000

## 2011-11-10 MED ORDER — HYDROMORPHONE HCL PF 1 MG/ML IJ SOLN
0.5000 mg | INTRAMUSCULAR | Status: DC | PRN
  Administered 2011-11-10 – 2011-11-11 (×7): 1 mg via INTRAVENOUS
  Filled 2011-11-10 (×8): qty 1

## 2011-11-10 MED ORDER — DARIFENACIN HYDROBROMIDE ER 7.5 MG PO TB24
7.5000 mg | ORAL_TABLET | Freq: Every day | ORAL | Status: DC
  Administered 2011-11-10 – 2011-11-12 (×3): 7.5 mg via ORAL
  Filled 2011-11-10 (×4): qty 1

## 2011-11-10 MED ORDER — PROMETHAZINE HCL 25 MG/ML IJ SOLN: 6.2500 mg | INTRAMUSCULAR | Status: DC | PRN

## 2011-11-10 MED ORDER — BUPIVACAINE HCL 0.75 % IJ SOLN
INTRAMUSCULAR | Status: DC | PRN
  Administered 2011-11-10: 2 mL via INTRATHECAL

## 2011-11-10 MED ORDER — ZOLPIDEM TARTRATE 5 MG PO TABS: 5.0000 mg | ORAL_TABLET | Freq: Every evening | ORAL | Status: DC | PRN

## 2011-11-10 MED ORDER — LACTATED RINGERS IV SOLN
INTRAVENOUS | Status: DC | PRN
  Administered 2011-11-10 (×2): via INTRAVENOUS

## 2011-11-10 MED ORDER — FERROUS SULFATE 325 (65 FE) MG PO TABS
325.0000 mg | ORAL_TABLET | Freq: Three times a day (TID) | ORAL | Status: DC
  Administered 2011-11-11 – 2011-11-13 (×7): 325 mg via ORAL
  Filled 2011-11-10 (×11): qty 1

## 2011-11-10 MED ORDER — LACTATED RINGERS IV SOLN: INTRAVENOUS | Status: DC

## 2011-11-10 MED ORDER — CEFAZOLIN SODIUM 1-5 GM-% IV SOLN
1.0000 g | Freq: Once | INTRAVENOUS | Status: AC
  Administered 2011-11-10 (×2): 1 g via INTRAVENOUS

## 2011-11-10 MED ORDER — ONDANSETRON HCL 4 MG/2ML IJ SOLN
INTRAMUSCULAR | Status: DC | PRN
  Administered 2011-11-10 (×2): 2 mg via INTRAVENOUS

## 2011-11-10 MED ORDER — ONDANSETRON HCL 4 MG/2ML IJ SOLN: 4.0000 mg | Freq: Four times a day (QID) | INTRAMUSCULAR | Status: DC | PRN

## 2011-11-10 MED ORDER — ACETAMINOPHEN 325 MG PO TABS: 650.0000 mg | ORAL_TABLET | Freq: Four times a day (QID) | ORAL | Status: DC | PRN

## 2011-11-10 MED ORDER — FENTANYL CITRATE 0.05 MG/ML IJ SOLN
INTRAMUSCULAR | Status: DC | PRN
  Administered 2011-11-10 (×2): 25 ug via INTRAVENOUS

## 2011-11-10 MED ORDER — PROPOFOL 10 MG/ML IV EMUL
INTRAVENOUS | Status: DC | PRN
  Administered 2011-11-10: 50 ug/kg/min via INTRAVENOUS

## 2011-11-10 MED ORDER — DEXTROSE 5 % IV SOLN
500.0000 mg | Freq: Four times a day (QID) | INTRAVENOUS | Status: DC | PRN
  Administered 2011-11-10: 500 mg via INTRAVENOUS
  Filled 2011-11-10 (×2): qty 5

## 2011-11-10 MED ORDER — OXYCODONE HCL 5 MG PO TABS
5.0000 mg | ORAL_TABLET | ORAL | Status: DC | PRN
  Administered 2011-11-10 – 2011-11-11 (×4): 10 mg via ORAL
  Filled 2011-11-10 (×4): qty 2

## 2011-11-10 MED ORDER — ACETAMINOPHEN 650 MG RE SUPP: 650.0000 mg | Freq: Four times a day (QID) | RECTAL | Status: DC | PRN

## 2011-11-10 MED ORDER — CYANOCOBALAMIN 500 MCG PO TABS
1000.0000 ug | ORAL_TABLET | Freq: Every day | ORAL | Status: DC
  Administered 2011-11-11 – 2011-11-13 (×3): 1000 ug via ORAL
  Filled 2011-11-10 (×4): qty 2

## 2011-11-10 MED ORDER — SODIUM CHLORIDE 0.9 % IR SOLN
Status: DC | PRN
  Administered 2011-11-10: 3000 mL

## 2011-11-10 MED ORDER — SCOPOLAMINE 1 MG/3DAYS TD PT72
MEDICATED_PATCH | TRANSDERMAL | Status: DC | PRN
  Administered 2011-11-10: 1.5 mg via TRANSDERMAL

## 2011-11-10 MED ORDER — METOCLOPRAMIDE HCL 5 MG/ML IJ SOLN: 5.0000 mg | Freq: Three times a day (TID) | INTRAMUSCULAR | Status: DC | PRN

## 2011-11-10 MED ORDER — FLEET ENEMA 7-19 GM/118ML RE ENEM: 1.0000 | ENEMA | Freq: Once | RECTAL | Status: AC | PRN

## 2011-11-10 MED ORDER — CHLORHEXIDINE GLUCONATE 4 % EX LIQD: 60.0000 mL | Freq: Once | CUTANEOUS | Status: DC

## 2011-11-10 MED ORDER — ACETAMINOPHEN 10 MG/ML IV SOLN
INTRAVENOUS | Status: DC | PRN
  Administered 2011-11-10: 1000 mg via INTRAVENOUS

## 2011-11-10 MED ORDER — DOCUSATE SODIUM 100 MG PO CAPS
100.0000 mg | ORAL_CAPSULE | Freq: Two times a day (BID) | ORAL | Status: DC
  Administered 2011-11-10 – 2011-11-13 (×6): 100 mg via ORAL
  Filled 2011-11-10 (×8): qty 1

## 2011-11-10 MED ORDER — HYDROMORPHONE HCL PF 1 MG/ML IJ SOLN
0.2500 mg | INTRAMUSCULAR | Status: DC | PRN
Start: 2011-11-10 — End: 2011-11-10
  Administered 2011-11-10: 0.5 mg via INTRAVENOUS

## 2011-11-10 MED ORDER — ONDANSETRON HCL 4 MG PO TABS
4.0000 mg | ORAL_TABLET | Freq: Four times a day (QID) | ORAL | Status: DC | PRN
  Administered 2011-11-12: 4 mg via ORAL
  Filled 2011-11-10: qty 1

## 2011-11-10 MED ORDER — ENOXAPARIN SODIUM 30 MG/0.3ML ~~LOC~~ SOLN
30.0000 mg | Freq: Two times a day (BID) | SUBCUTANEOUS | Status: DC
  Administered 2011-11-10 – 2011-11-13 (×6): 30 mg via SUBCUTANEOUS
  Filled 2011-11-10 (×8): qty 0.3

## 2011-11-10 MED ORDER — PHENOL 1.4 % MT LIQD: 1.0000 | OROMUCOSAL | Status: DC | PRN

## 2011-11-10 MED ORDER — BISACODYL 10 MG RE SUPP: 10.0000 mg | Freq: Every day | RECTAL | Status: DC | PRN

## 2011-11-10 MED ORDER — METOCLOPRAMIDE HCL 10 MG PO TABS: 5.0000 mg | ORAL_TABLET | Freq: Three times a day (TID) | ORAL | Status: DC | PRN

## 2011-11-10 MED ORDER — ALUM & MAG HYDROXIDE-SIMETH 200-200-20 MG/5ML PO SUSP: 30.0000 mL | ORAL | Status: DC | PRN

## 2011-11-10 MED ORDER — MENTHOL 3 MG MT LOZG
1.0000 | LOZENGE | OROMUCOSAL | Status: DC | PRN
  Administered 2011-11-12: 3 mg via ORAL
  Filled 2011-11-10: qty 9

## 2011-11-10 SURGICAL SUPPLY — 64 items
BAG SPEC THK2 15X12 ZIP CLS (MISCELLANEOUS) ×2
BAG ZIPLOCK 12X15 (MISCELLANEOUS) ×4 IMPLANT
BANDAGE ACE 4 STERILE (GAUZE/BANDAGES/DRESSINGS) ×1 IMPLANT
BANDAGE ELASTIC 4 VELCRO ST LF (GAUZE/BANDAGES/DRESSINGS) ×2 IMPLANT
BANDAGE ELASTIC 6 VELCRO ST LF (GAUZE/BANDAGES/DRESSINGS) ×2 IMPLANT
BANDAGE ESMARK 6X9 LF (GAUZE/BANDAGES/DRESSINGS) ×1 IMPLANT
BANDAGE GAUZE ELAST BULKY 4 IN (GAUZE/BANDAGES/DRESSINGS) ×3 IMPLANT
BLADE SAG 18X100X1.27 (BLADE) ×2 IMPLANT
BLADE SAW SGTL 13.0X1.19X90.0M (BLADE) ×2 IMPLANT
BNDG CMPR 9X6 STRL LF SNTH (GAUZE/BANDAGES/DRESSINGS) ×1
BNDG ESMARK 6X9 LF (GAUZE/BANDAGES/DRESSINGS) ×2
CEMENT HV SMART SET (Cement) ×4 IMPLANT
CLOTH BEACON ORANGE TIMEOUT ST (SAFETY) ×2 IMPLANT
CUFF TOURN SGL QUICK 34 (TOURNIQUET CUFF) ×2
CUFF TRNQT CYL 34X4X40X1 (TOURNIQUET CUFF) ×1 IMPLANT
DRAPE EXTREMITY T 121X128X90 (DRAPE) ×2 IMPLANT
DRAPE LG THREE QUARTER DISP (DRAPES) ×2 IMPLANT
DRAPE POUCH INSTRU U-SHP 10X18 (DRAPES) ×2 IMPLANT
DRAPE U-SHAPE 47X51 STRL (DRAPES) ×2 IMPLANT
DRSG PAD ABDOMINAL 8X10 ST (GAUZE/BANDAGES/DRESSINGS) ×3 IMPLANT
DURAPREP 26ML APPLICATOR (WOUND CARE) ×2 IMPLANT
ELECT REM PT RETURN 9FT ADLT (ELECTROSURGICAL) ×2
ELECTRODE REM PT RTRN 9FT ADLT (ELECTROSURGICAL) ×1 IMPLANT
EVACUATOR 1/8 PVC DRAIN (DRAIN) ×2 IMPLANT
FACESHIELD LNG OPTICON STERILE (SAFETY) ×10 IMPLANT
GAUZE SPONGE 4X4 12PLY STRL LF (GAUZE/BANDAGES/DRESSINGS) ×1 IMPLANT
GAUZE XEROFORM 2X2 STRL (GAUZE/BANDAGES/DRESSINGS) ×2 IMPLANT
GLOVE ECLIPSE 8.0 STRL XLNG CF (GLOVE) ×2 IMPLANT
GLOVE SURG ORTHO 8.0 STRL STRW (GLOVE) ×2 IMPLANT
GLOVE SURG ORTHO 9.0 STRL STRW (GLOVE) ×2 IMPLANT
GLOVE SURG SS PI 7.5 STRL IVOR (GLOVE) ×3 IMPLANT
GOWN PREVENTION PLUS XLARGE (GOWN DISPOSABLE) ×6 IMPLANT
GOWN STRL NON-REIN LRG LVL3 (GOWN DISPOSABLE) ×2 IMPLANT
GOWN STRL REIN XL XLG (GOWN DISPOSABLE) ×2 IMPLANT
HANDPIECE INTERPULSE COAX TIP (DISPOSABLE) ×2
IMMOBILIZER KNEE 20 (SOFTGOODS) ×2
IMMOBILIZER KNEE 20 THIGH 36 (SOFTGOODS) IMPLANT
KIT BASIN OR (CUSTOM PROCEDURE TRAY) ×2 IMPLANT
NS IRRIG 1000ML POUR BTL (IV SOLUTION) ×2 IMPLANT
PACK TOTAL JOINT (CUSTOM PROCEDURE TRAY) ×2 IMPLANT
POSITIONER SURGICAL ARM (MISCELLANEOUS) ×2 IMPLANT
SET HNDPC FAN SPRY TIP SCT (DISPOSABLE) ×1 IMPLANT
SET PAD KNEE POSITIONER (MISCELLANEOUS) ×2 IMPLANT
SPONGE GAUZE 4X4 12PLY (GAUZE/BANDAGES/DRESSINGS) ×2 IMPLANT
SPONGE LAP 18X18 X RAY DECT (DISPOSABLE) IMPLANT
SPONGE SURGIFOAM ABS GEL 100 (HEMOSTASIS) ×2 IMPLANT
STOCKINETTE 6  STRL (DRAPES) ×1
STOCKINETTE 6 STRL (DRAPES) ×1 IMPLANT
STRIP CLOSURE SKIN 1/2X4 (GAUZE/BANDAGES/DRESSINGS) ×4 IMPLANT
SUCTION FRAZIER 12FR DISP (SUCTIONS) ×2 IMPLANT
SUT BONE WAX W31G (SUTURE) ×2 IMPLANT
SUT MNCRL AB 3-0 PS2 18 (SUTURE) ×2 IMPLANT
SUT VIC AB 0 CT1 27 (SUTURE) ×4
SUT VIC AB 0 CT1 27XBRD ANTBC (SUTURE) ×2 IMPLANT
SUT VIC AB 1 CT1 27 (SUTURE) ×14
SUT VIC AB 1 CT1 27XBRD ANTBC (SUTURE) ×7 IMPLANT
SUT VIC AB 2-0 CT1 27 (SUTURE) ×4
SUT VIC AB 2-0 CT1 TAPERPNT 27 (SUTURE) ×2 IMPLANT
TAPE STRIPS DRAPE STRL (GAUZE/BANDAGES/DRESSINGS) ×2 IMPLANT
TOWEL OR 17X26 10 PK STRL BLUE (TOWEL DISPOSABLE) ×6 IMPLANT
TOWER CARTRIDGE SMART MIX (DISPOSABLE) ×2 IMPLANT
TRAY FOLEY CATH 14FRSI W/METER (CATHETERS) ×2 IMPLANT
WATER STERILE IRR 1500ML POUR (IV SOLUTION) ×2 IMPLANT
WRAP KNEE MAXI GEL POST OP (GAUZE/BANDAGES/DRESSINGS) ×4 IMPLANT

## 2011-11-10 NOTE — Anesthesia Procedure Notes (Addendum)
Spinal  Patient location during procedure: OR Start time: 11/10/2011 8:00 AM Staffing Performed by: anesthesiologist  Preanesthetic Checklist Completed: patient identified, site marked, surgical consent, pre-op evaluation, timeout performed, IV checked, risks and benefits discussed and monitors and equipment checked Spinal Block Patient position: sitting Prep: Betadine Patient monitoring: heart rate, continuous pulse ox and blood pressure Location: L3-4 Injection technique: single-shot Needle Needle type: Sprotte  Needle gauge: 24 G Needle length: 9 cm Additional Notes Expiration date of kit checked and confirmed. Patient tolerated procedure well, without complications.    Performed by: Valeda Malm

## 2011-11-10 NOTE — Op Note (Signed)
DATE OF SURGERY:  11/10/2011  TIME: 9:41 AM  PATIENT NAME:  Alison Long    AGE: 55 y.o.   PRE-OPERATIVE DIAGNOSIS:  Osteoarthritis of the Left Knee  POST-OPERATIVE DIAGNOSIS:  Osteoarthritis of the Left Knee  PROCEDURE:  Procedure(s): TOTAL KNEE ARTHROPLASTY  SURGEON:  Deantae Shackleton ANDREW  ASSISTANT:  Oneida Alar, PA-C, present and scrubbed throughout the case, critical for assistance with exposure, retraction, instrumentation, and closure.  OPERATIVE IMPLANTS: Depuy PFC Sigma Rotating Platform.  Femur size 3, Tibia size 2.5Patella size 35 3-peg oval button, with a 10 mm polyethylene insert.   PREOPERATIVE INDICATIONS:   Alison Long is a 55 y.o. year old female with end stage bone on bone arthritis of the knee who failed conservative treatment and elected for Total Knee Arthroplasty.   The risks, benefits, and alternatives were discussed at length including but not limited to the risks of infection, bleeding, nerve injury, stiffness, blood clots, the need for revision surgery, cardiopulmonary complications, among others, and they were willing to proceed.  OPERATIVE DESCRIPTION:  The patient was brought to the operative room and placed in a supine position.  Spinal anesthesia was administered.  IV antibiotics were given.  The lower extremity was prepped and draped in the usual sterile fashion.  Time out was performed.  The leg was elevated and exsanguinated and the tourniquet was inflated.  Anterior quadriceps tendon splitting approach was performed.  The patella was retracted and osteophytes were removed.  The anterior horn of the medial and lateral meniscus was removed and cruciate ligaments resected.   The distal femur was opened with the drill and the intramedullary distal femoral cutting jig was utilized, set at 5 degrees resecting 10 mm off the distal femur.  Care was taken to protect the collateral ligaments.  The distal femoral sizing jig was applied, taking care to  avoid notching.  Then the 4-in-1 cutting jig was applied and the anterior and posterior femur was cut, along with the chamfer cuts.    Then the extramedullary tibial cutting jig was utilized making the appropriate cut using the anterior tibial crest as a reference building in appropriate posterior slope.  Care was taken during the cut to protect the medial and collateral ligaments.  The proximal tibia was removed along with the posterior horns of the menisci.   The posterior medial femoral osteophytes and posterior lateral femoral osteophytes were removed.    The flexion gap was then measured and was symmetric with the extension gap, measured at 10.  I completed the distal femoral preparation using the appropriate jig to prepare the box.  The patella was then measured, and cut with the saw.    The proximal tibia sized and prepared accordingly with the reamer and the punch, and then all components were trialed with the trial insert.  The knee was found to have excellent balance and full motion.    The above named components were then cemented into place and all excess cement was removed.  The trial polyethylene component was in place during cementation, and then was exchanged for the real polyethylene component.    The knee was easily taken through a range of motion and the patella tracked well and the knee irrigated copiously and the parapatellar and subcutaneous tissue closed with vicryl, and monocryl with steri strips for the skin.  The arthrotomy was closed at 90 of flexion. The wounds were dressed with sterile gauze and the tourniquet released and the patient was awakened and returned to  the PACU in stable and satisfactory condition.  There were no complications.  Total tourniquet time was85 minutes.                 DATE OF SURGERY:  11/10/2011  TIME: 9:41 AM  PATIENT NAME:  Alison Long    AGE: 55 y.o.   PRE-OPERATIVE DIAGNOSIS:  Osteoarthritis of the Left  Knee  POST-OPERATIVE DIAGNOSIS:  Osteoarthritis of the Left Knee  PROCEDURE:  Procedure(s): TOTAL KNEE ARTHROPLASTY  SURGEON:  Jamina Macbeth ANDREW  ASSISTANT:  Oneida Alar, PA-C, present and scrubbed throughout the case, critical for assistance with exposure, retraction, instrumentation, and closure.  OPERATIVE IMPLANTS: Depuy PFC Sigma Rotating Platform.  Femur size 3, Tibia size 2, Patella size35 3-peg oval button, with a 10 mm polyethylene insert.   PREOPERATIVE INDICATIONS:   Alison Long is a 55 y.o. year old female with end stage bone on bone arthritis of the knee who failed conservative treatment and elected for Total Knee Arthroplasty.   The risks, benefits, and alternatives were discussed at length including but not limited to the risks of infection, bleeding, nerve injury, stiffness, blood clots, the need for revision surgery, cardiopulmonary complications, among others, and they were willing to proceed.  OPERATIVE DESCRIPTION:  The patient was brought to the operative room and placed in a supine position.  Spinal anesthesia was administered.  IV antibiotics were given.  The lower extremity was prepped and draped in the usual sterile fashion.  Time out was performed.  The leg was elevated and exsanguinated and the tourniquet was inflated.  Anterior quadriceps tendon splitting approach was performed.  The patella was retracted and osteophytes were removed.  The anterior horn of the medial and lateral meniscus was removed and cruciate ligaments resected.   The distal femur was opened with the drill and the intramedullary distal femoral cutting jig was utilized, set at 5 degrees resecting 10 mm off the distal femur.  Care was taken to protect the collateral ligaments.  The distal femoral sizing jig was applied, taking care to avoid notching.  Then the 4-in-1 cutting jig was applied and the anterior and posterior femur was cut, along with the chamfer cuts.    Then the  extramedullary tibial cutting jig was utilized making the appropriate cut using the anterior tibial crest as a reference building in appropriate posterior slope.  Care was taken during the cut to protect the medial and collateral ligaments.  The proximal tibia was removed along with the posterior horns of the menisci.   The posterior medial femoral osteophytes and posterior lateral femoral osteophytes were removed.    The flexion gap was then measured and was symmetric with the extension gap, measured at 10.  I completed the distal femoral preparation using the appropriate jig to prepare the box.  The patella was then measured, and cut with the saw.    The proximal tibia sized and prepared accordingly with the reamer and the punch, and then all components were trialed with the trial insert.  The knee was found to have excellent balance and full motion.    The above named components were then cemented into place and all excess cement was removed.  The trial polyethylene component was in place during cementation, and then was exchanged for the real polyethylene component.    The knee was easily taken through a range of motion and the patella tracked well and the knee irrigated copiously and the parapatellar and subcutaneous tissue closed with vicryl, and  monocryl with steri strips for the skin.  The arthrotomy was closed at 90 of flexion. The wounds were dressed with sterile gauze and the tourniquet released and the patient was awakened and returned to the PACU in stable and satisfactory condition.  There were no complications.  Total tourniquet time was 85 minutes.                 DATE OF SURGERY:  11/10/2011  TIME: 9:42 AM  PATIENT NAME:  Alison Long    AGE: 55 y.o.   PRE-OPERATIVE DIAGNOSIS:  Osteoarthritis of the Left Knee  POST-OPERATIVE DIAGNOSIS:  Osteoarthritis of the Left Knee  PROCEDURE:  Procedure(s): TOTAL KNEE ARTHROPLASTY  SURGEON:  Ernesto Lashway  ANDREW  ASSISTANT:  Oneida Alar, PA-C, present and scrubbed throughout the case, critical for assistance with exposure, retraction, instrumentation, and closure.  OPERATIVE IMPLANTS: Depuy PFC Sigma Rotating Platform.  Femur size 3, Tibia size 2, Patellla 35 3-peg oval button, with a 10 mm polyethylene insert.   PREOPERATIVE INDICATIONS:   SHADEN HIGLEY is a 55 y.o. year old female with end stage bone on bone arthritis of the knee who failed conservative treatment and elected for Total Knee Arthroplasty.   The risks, benefits, and alternatives were discussed at length including but not limited to the risks of infection, bleeding, nerve injury, stiffness, blood clots, the need for revision surgery, cardiopulmonary complications, among others, and they were willing to proceed.  OPERATIVE DESCRIPTION:  The patient was brought to the operative room and placed in a supine position.  Spinal anesthesia was administered.  IV antibiotics were given.  The lower extremity was prepped and draped in the usual sterile fashion.  Time out was performed.  The leg was elevated and exsanguinated and the tourniquet was inflated.  Anterior quadriceps tendon splitting approach was performed.  The patella was retracted and osteophytes were removed.  The anterior horn of the medial and lateral meniscus was removed and cruciate ligaments resected.   The distal femur was opened with the drill and the intramedullary distal femoral cutting jig was utilized, set at 5 degrees resecting 10 mm off the distal femur.  Care was taken to protect the collateral ligaments.  The distal femoral sizing jig was applied, taking care to avoid notching.  Then the 4-in-1 cutting jig was applied and the anterior and posterior femur was cut, along with the chamfer cuts.    Then the extramedullary tibial cutting jig was utilized making the appropriate cut using the anterior tibial crest as a reference building in appropriate posterior slope.   Care was taken during the cut to protect the medial and collateral ligaments.  The proximal tibia was removed along with the posterior horns of the menisci.   The posterior medial femoral osteophytes and posterior lateral femoral osteophytes were removed.    The flexion gap was then measured and was symmetric with the extension gap, measured at 10.  I completed the distal femoral preparation using the appropriate jig to prepare the box.  The patella was then measured, and cut with the saw.    The proximal tibia sized and prepared accordingly with the reamer and the punch, and then all components were trialed with the trial insert.  The knee was found to have excellent balance and full motion.    The above named components were then cemented into place and all excess cement was removed.  The trial polyethylene component was in place during cementation, and then was exchanged for the  real polyethylene component.    The knee was easily taken through a range of motion and the patella tracked well and the knee irrigated copiously and the parapatellar and subcutaneous tissue closed with vicryl, and monocryl with steri strips for the skin.  The arthrotomy was closed at 90 of flexion. The wounds were dressed with sterile gauze and the tourniquet released and the patient was awakened and returned to the PACU in stable and satisfactory condition.  There were no complications.  Total tourniquet time was 85 minutes.

## 2011-11-10 NOTE — H&P (Signed)
NAME:  Alison Long, Alison Long NO.:  [id number]  MEDICAL RECORD NO.:  [id number]  LOCATION:  [facility]                        FACILITY:  [facility]  PHYSICIAN:  Erasmo Leventhal, M.D.DATE OF BIRTH:  June 29, 1956  DATE OF ADMISSION:  11/10/2011 DATE OF DISCHARGE:                             HISTORY & PHYSICAL   CHIEF COMPLAINT:  Painful range of motion and weightbearing, left knee.  HISTORY OF PRESENT ILLNESS:  The patient is a 55 year old female, well known to Dr. Thomasena Edis, for evaluation and treatment of bilateral knee pain.  The patient was found to have significant arthritic issues in her left knee.  She has failed conservative treatment.  The patient has had a very successful right total knee arthroplasty and the patient elected to proceed with a left total knee arthroplasty due to the amount of her pain.  The patient has been cleared by her primary care physician Dr. Holley Bouche for this issue.  ALLERGIES:  SULFA causing hives.  PRIMARY CARE PHYSICIAN:  Holley Bouche, M.D.  MEDICATIONS: 1. Integra plus once a day. 2. Sertraline 50 mg once a day. 3. Enablex once a day. 4. Glucosamine and chondroitin 2 tablets a day. 5. Fish oil 1200 mg twice a day. 6. Calcium 600 plus vitamin D 2 tablets a day. 7. Vitamin D3 1000 international units a day. 8. Vitamin B12 500 mg 2 tablets a day. 9. Aspirin 81 mg a day. 10.Vagifem 10 mcg 2 times a week. 11.Voltaren gel as needed. 12.Tylenol Arthritis as needed. 13.Norco as needed.  PAST MEDICAL HISTORY:  Includes: 1. History of shingles. 2. Urinary incontinence.  REVIEW OF SYSTEMS:  NEUROLOGIC:  She has had shingles across her abdomen in the past.  She denies any strokes, seizures, convulsions, memory issues, visual issues, any unusual numbness or tingling.  PULMONARY: She denies any shortness of breath, productive cough.  No wheezes, no history of frequent pneumonias, no history of COPD, sleep apnea.  She denies any  problems of testing for tuberculosis.  CARDIOVASCULAR:  She denies any history of previous stress test.  She denies any feeling her heart is beating too fast, too slow, skipping beats, periods of syncope, periods of shortness of breath or any chest discomfort.  GI:  She denies any histories of unusual irritation with reflux disease, heartburn, indigestion, unusual constipation, diarrhea, no abdominal fullness.  No abdominal discomfort.  No history of hepatitis, no problems with diverticulitis in the past.  GU:  She does have a little bit of urinary incontinence, she says, with a dropped bladder, but she denies any problems with frequent UTIs, failure to urinate, cystitis or kidney stones.  ENDOCRINE:  She denies any history of thyroid or diabetic issues.  No problems with increased urination, difficulty with urination.  No cold or heat intolerances.  PAST SURGICAL HISTORY:  Includes: 1. Knee arthroscopies in 2009, right and the left. 2. A right total knee arthroplasty in 2012 with just some problems     with anesthesia and nausea with knee arthroscopies only.  FAMILY MEDICAL HISTORY:  Father is deceased at 57 from a melanoma. Mother had died from breast cancer at the age of 53.  SOCIAL HISTORY:  The patient is married.  She works as a case advocate. She does not smoke or use alcohol.  She has got 2 grown children.  She lives in a 2 story home and she will have her family provide care for her postop.  PHYSICAL EXAMINATION:  VITAL SIGNS:  Height is 5 feet 3 inches, weight is 165.  Blood pressure is 128/90, pulse of 80 and regular. Respirations 12, nonlabored. GENERAL:  This is a healthy-appearing, well-developed female, conscious, alert, appropriate, appears to be a good historian, appears to be in no distress. NECK:  Supple.  No palpable lymphadenopathy.  Good range of motion. CHEST:  Lung sounds are clear throughout.  No wheezings. HEART:  Regular rate and rhythm. ABDOMEN:  Soft.   Bowel sounds present. EXTREMITIES:  Upper extremities have good range of motion.  Good motor strength.  Lower extremities, right knee had a well-healed midline surgical incision.  She is able to fully extend, she flex it back to 120.  She has no instability.  Calf is soft and nontender.  No signs of phlebitis or edema.  Good motion of the right ankle.  Her left knee, she did have crepitus in the knee with full extension and flexion back to 120 with discomfort.  No ligament instability.  Calf is soft and nontender.  No signs of phlebitis or edema.  Good pulses in the ankle. PERIPHERAL VASCULAR:  She had no carotid bruits.  She had good carotid pulses.  Good radial pulses.  Good posterior tibial pulses.  No lower extremity edema or venous stasis changes. BREAST, RECTAL AND GU:  Exams were deferred at this time.  IMPRESSION: 1. End-stage osteoarthritis, left knee. 2. Recent right total knee arthroplasty. 3. History of shingles. 4. History of urinary incontinence.  DISPOSITION:  At this time, after viewing the patient's findings, the patient elected to proceed with a left total knee arthroplasty at [facility] under the care of Dr. Thomasena Edis on November 10, 2011.  The patient's medical history was reviewed.  She has been cleared by her primary care physician for this upcoming surgical procedure.  She will undergo all routine labs and tests prior to.     Jamelle Rushing, P.A.   ______________________________ Erasmo Leventhal, M.D.    RWK/MEDQ  D:  10/25/2011  T:  10/25/2011  Job:  [id number]  cc:   Erasmo Leventhal, M.D. Fax: [phone] have seen and examined this patient.  Agree with the note above.  Jeanpierre Thebeau ANDREW 11/10/2011 7:44 AM

## 2011-11-10 NOTE — Anesthesia Preprocedure Evaluation (Signed)
Anesthesia Evaluation  Patient identified by MRN, date of birth, ID band Patient awake    Reviewed: Allergy & Precautions, H&P , NPO status , Patient's Chart, lab work & pertinent test results  History of Anesthesia Complications (+) PONV and Family history of anesthesia reaction  Airway Mallampati: II TM Distance: >3 FB Neck ROM: Full    Dental No notable dental hx.    Pulmonary neg pulmonary ROS,  clear to auscultation  Pulmonary exam normal       Cardiovascular neg cardio ROS Regular Normal    Neuro/Psych PSYCHIATRIC DISORDERS Depression Negative Neurological ROS  Negative Psych ROS   GI/Hepatic negative GI ROS, Neg liver ROS,   Endo/Other  Negative Endocrine ROS  Renal/GU negative Renal ROS  Genitourinary negative   Musculoskeletal negative musculoskeletal ROS (+)   Abdominal   Peds negative pediatric ROS (+)  Hematology negative hematology ROS (+)   Anesthesia Other Findings   Reproductive/Obstetrics negative OB ROS                           Anesthesia Physical Anesthesia Plan  ASA: II  Anesthesia Plan: Spinal   Post-op Pain Management:    Induction: Intravenous  Airway Management Planned:   Additional Equipment:   Intra-op Plan:   Post-operative Plan: Extubation in OR  Informed Consent: I have reviewed the patients History and Physical, chart, labs and discussed the procedure including the risks, benefits and alternatives for the proposed anesthesia with the patient or authorized representative who has indicated his/her understanding and acceptance.   Dental advisory given  Plan Discussed with: CRNA  Anesthesia Plan Comments: (Consents to spinal. She says in April a spinal was attempted unsuccessfully.  Discussed risks including bleeding , infection, nerve damage, headache, backache, failure.)        Anesthesia Quick Evaluation

## 2011-11-10 NOTE — Transfer of Care (Signed)
Immediate Anesthesia Transfer of Care Note  Patient: Alison Long  Procedure(s) Performed:  TOTAL KNEE ARTHROPLASTY  Patient Location: PACU  Anesthesia Type: Regional  Level of Consciousness: awake and alert   Airway & Oxygen Therapy: Patient Spontanous Breathing and Patient connected to face mask  Post-op Assessment: Report given to PACU RN and Post -op Vital signs reviewed and stable  Post vital signs: Reviewed and stable  Complications: No apparent anesthesia complications

## 2011-11-10 NOTE — Anesthesia Postprocedure Evaluation (Signed)
  Anesthesia Post-op Note  Patient: Alison Long  Procedure(s) Performed:  TOTAL KNEE ARTHROPLASTY  Patient Location: PACU  Anesthesia Type: Spinal  Level of Consciousness: awake and alert   Airway and Oxygen Therapy: Patient Spontanous Breathing  Post-op Pain: mild  Post-op Assessment: Post-op Vital signs reviewed, Patient's Cardiovascular Status Stable, Respiratory Function Stable, Patent Airway and No signs of Nausea or vomiting  Post-op Vital Signs: stable  Complications: No apparent anesthesia complications

## 2011-11-10 NOTE — Progress Notes (Signed)
CSW consulted for SNF placement.  According to nsg pt plans to return home with [facility] upon D/C . Pt does not have SNF benefits ( [employer] EMP PPO ). If plan changes and SNF needed, CSW will discuss pvt pay options with pt.

## 2011-11-11 LAB — BASIC METABOLIC PANEL
CO2: 28 mEq/L (ref 19–32)
Calcium: 9.3 mg/dL (ref 8.4–10.5)
Creatinine, Ser: 0.61 mg/dL (ref 0.50–1.10)
Glucose, Bld: 134 mg/dL — ABNORMAL HIGH (ref 70–99)

## 2011-11-11 LAB — CBC
MCH: 30.9 pg (ref 26.0–34.0)
MCV: 92 fL (ref 78.0–100.0)
Platelets: 289 10*3/uL (ref 150–400)
RBC: 4.14 MIL/uL (ref 3.87–5.11)

## 2011-11-11 MED ORDER — OXYCODONE HCL 5 MG PO TABS
5.0000 mg | ORAL_TABLET | ORAL | Status: DC | PRN
  Administered 2011-11-11 (×2): 10 mg via ORAL
  Administered 2011-11-11: 5 mg via ORAL
  Administered 2011-11-11: 10 mg via ORAL
  Administered 2011-11-12 – 2011-11-13 (×7): 15 mg via ORAL
  Filled 2011-11-11: qty 3
  Filled 2011-11-11 (×2): qty 2
  Filled 2011-11-11 (×4): qty 3
  Filled 2011-11-11: qty 2
  Filled 2011-11-11: qty 1
  Filled 2011-11-11 (×2): qty 3

## 2011-11-11 NOTE — Progress Notes (Signed)
Physical Therapy Evaluation Patient Details Name: Alison Long MRN: [id number] DOB: 1956-02-08 Today's Date: 11/11/2011 1047 - 1117 Problem List: There is no problem list on file for this patient.   Past Medical History:  Past Medical History  Diagnosis Date  . PONV (postoperative nausea and vomiting)     severe nausea   . Anemia   . Chronic kidney disease     bladder has dropped   . Arthritis     arthritis in knees   . Depression    Past Surgical History:  Past Surgical History  Procedure Date  . Joint replacement     right knee 4/12   . Other surgical history     bilateral arthroscopic surbery knees     PT Assessment/Plan/Recommendation PT Assessment Clinical Impression Statement: Pt with L TKR presents with decreased L Le ROM/strength and decreased functional mobility.  Pt will benefit from skilled PT intervention to maximize IND for d/c home. PT Recommendation/Assessment: Patient will need skilled PT in the acute care venue PT Problem List: Decreased strength;Decreased range of motion;Decreased activity tolerance;Decreased mobility;Decreased knowledge of use of DME;Pain PT Therapy Diagnosis : Difficulty walking PT Plan PT Frequency: 7X/week PT Treatment/Interventions: DME instruction;Gait training;Stair training;Functional mobility training;Therapeutic activities;Therapeutic exercise;Patient/family education PT Recommendation Recommendations for Other Services: OT consult Follow Up Recommendations: Home health PT Equipment Recommended: None recommended by PT PT Goals  Acute Rehab PT Goals PT Goal Formulation: With patient Time For Goal Achievement: 7 days Pt will go Supine/Side to Sit: with supervision PT Goal: Supine/Side to Sit - Progress: Not met Pt will go Sit to Supine/Side: with supervision PT Goal: Sit to Supine/Side - Progress: Not met Pt will go Sit to Stand: with supervision PT Goal: Sit to Stand - Progress: Not met Pt will go Stand to Sit: with  supervision PT Goal: Stand to Sit - Progress: Not met Pt will Ambulate: 51 - 150 feet;with supervision;with rolling walker PT Goal: Ambulate - Progress: Not met Pt will Go Up / Down Stairs: 3-5 stairs;with supervision;with rolling walker PT Goal: Up/Down Stairs - Progress: Not met  PT Evaluation Precautions/Restrictions  Precautions Precautions: Knee Required Braces or Orthoses: Yes Knee Immobilizer: Discontinue once straight leg raise with < 10 degree lag Restrictions Other Position/Activity Restrictions: WBAT Prior Functioning  Home Living Lives With: Spouse Receives Help From: Family Type of Home: House Home Layout: One level Home Access: Stairs to enter Entrance Stairs-Rails: Doctor, general practice of Steps: 5 Prior Function Level of Independence: Independent with transfers;Independent with basic ADLs;Requires assistive device for independence Able to Take Stairs?: Yes Cognition Cognition Arousal/Alertness: Awake/alert Overall Cognitive Status: Appears within functional limits for tasks assessed Orientation Level: Oriented X4 Sensation/Coordination Coordination Gross Motor Movements are Fluid and Coordinated: Yes Extremity Assessment RUE Assessment RUE Assessment: Within Functional Limits LUE Assessment LUE Assessment: Within Functional Limits RLE Assessment RLE Assessment: Within Functional Limits LLE Assessment LLE Assessment: Exceptions to [facility] (-10 - 35 with 2/5 quad strength) Mobility (including Balance) Bed Mobility Bed Mobility: Yes Supine to Sit: 4: Min assist Transfers Transfers: Yes Sit to Stand: 4: Min assist;From bed;With upper extremity assist Sit to Stand Details (indicate cue type and reason): Cues for use of UEs and for LE position Stand to Sit: To chair/3-in-1;4: Min assist;With upper extremity assist;With armrests Stand to Sit Details: Cues for use of UEs and for LE position Ambulation/Gait Ambulation/Gait: Yes Ambulation/Gait  Assistance: 4: Min assist Ambulation/Gait Assistance Details (indicate cue type and reason): cues for posture and position from RW  Ambulation Distance (Feet): 40 Feet Assistive device: Rolling walker Gait Pattern: Step-to pattern    Exercise  Total Joint Exercises Ankle Circles/Pumps: AROM;Both;10 reps;Supine Quad Sets: AROM;10 reps;Supine;Both Heel Slides: AAROM;10 reps;Supine;Left Straight Leg Raises: AAROM;10 reps;Supine;Left End of Session PT - End of Session Activity Tolerance: Patient tolerated treatment well Patient left: in chair;with call bell in reach;with family/visitor present Nurse Communication: Mobility status for transfers;Mobility status for ambulation General Behavior During Session: [facility] for tasks performed Cognition: [facility] for tasks performed  Alison Long 11/11/2011, 2:13 PM

## 2011-11-11 NOTE — Progress Notes (Signed)
Subjective:More pain this time, otherwise no complaint.  Denies and SOB,Abd pain. Pain meds last about 3 hrs Objective: Vital signs in last 24 hours: Temp:  [96.9 F (36.1 C)-99.3 F (37.4 C)] 98.9 F (37.2 C) (12/15 0610) Pulse Rate:  [17-108] 83  (12/15 0610) Resp:  [10-17] 10  (12/15 0610) BP: (116-158)/(68-92) 146/87 mmHg (12/15 0610) SpO2:  [96 %-100 %] 98 % (12/15 0610) Weight:  [74.844 kg (165 lb)] 165 lb (74.844 kg) (12/14 1208)  Intake/Output from previous day: 12/14 0701 - 12/15 0700 In: 4617.5 [P.O.:1260; I.V.:3202.5; IV Piggyback:155] Out: 3900 [Urine:3525; Drains:300; Blood:75] Intake/Output this shift:     Basename 11/11/11 0445  HGB 12.8    Basename 11/11/11 0445  WBC 11.4*  RBC 4.14  HCT 38.1  PLT 289    Basename 11/11/11 0445  NA 134*  K 3.8  CL 98  CO2 28  BUN 6  CREATININE 0.61  GLUCOSE 134*  CALCIUM 9.3   No results found for this basename: LABPT:2,INR:2 in the last 72 hours  Dressing clean and dry, calf soft but sore, foot NMVI. Hemavac D/Cd intact. Lungs clear, Heart reg, Abd sof NT normal BS.  Assessment/Plan: POD 1 S/P Left TKA stable Plan D/C foley, Decrease IV fluids, OOB with PT per protocol.   Alison Long 11/11/2011, 7:32 AM

## 2011-11-11 NOTE — Progress Notes (Signed)
Physical Therapy Treatment Patient Details Name: Alison Long MRN: [id number] DOB: [birth year] Today's Date: 11/11/2011 1340 - 1403; 2GT PT Assessment/Plan  PT - Assessment/Plan PT Plan: Discharge plan remains appropriate PT Frequency: 7X/week Follow Up Recommendations: Home health PT Equipment Recommended: None recommended by PT PT Goals  Acute Rehab PT Goals PT Goal Formulation: With patient Time For Goal Achievement: 7 days Pt will go Supine/Side to Sit: with supervision PT Goal: Supine/Side to Sit - Progress: Not met Pt will go Sit to Supine/Side: with supervision PT Goal: Sit to Supine/Side - Progress: Progressing toward goal Pt will go Sit to Stand: with supervision PT Goal: Sit to Stand - Progress: Progressing toward goal Pt will go Stand to Sit: with supervision PT Goal: Stand to Sit - Progress: Progressing toward goal Pt will Ambulate: 51 - 150 feet;with supervision;with rolling walker PT Goal: Ambulate - Progress: Progressing toward goal Pt will Go Up / Down Stairs: 3-5 stairs;with supervision;with rolling walker PT Goal: Up/Down Stairs - Progress: Not met  PT Treatment Precautions/Restrictions  Precautions Precautions: Knee Required Braces or Orthoses: Yes Knee Immobilizer: Discontinue once straight leg raise with < 10 degree lag Restrictions Weight Bearing Restrictions: No Other Position/Activity Restrictions: WBAT Mobility (including Balance) Bed Mobility Bed Mobility: Yes Supine to Sit: 4: Min assist Sit to Supine - Right: 4: Min assist Transfers Transfers: Yes Sit to Stand: 4: Min assist;From chair/3-in-1;With upper extremity assist;With armrests Sit to Stand Details (indicate cue type and reason): Cues for use of UEs and for LE position Stand to Sit: To chair/3-in-1;To bed;With armrests;With upper extremity assist;4: Min assist Stand to Sit Details: Cues for use of UEs and for LE position Ambulation/Gait Ambulation/Gait: Yes Ambulation/Gait  Assistance: 4: Min assist Ambulation/Gait Assistance Details (indicate cue type and reason): cues for posture and position from RW Ambulation Distance (Feet):  (25' and 68') Assistive device: Rolling walker Gait Pattern: Step-to pattern    t End of Session PT - End of Session Activity Tolerance: Patient tolerated treatment well Patient left: in bed;with call bell in reach;with family/visitor present Nurse Communication: Mobility status for transfers;Mobility status for ambulation General Behavior During Session: [facility] for tasks performed Cognition: [facility] for tasks performed  Alison Long 11/11/2011, 2:18 PM

## 2011-11-12 LAB — BASIC METABOLIC PANEL
Calcium: 9.4 mg/dL (ref 8.4–10.5)
Creatinine, Ser: 0.67 mg/dL (ref 0.50–1.10)
GFR calc Af Amer: >90 mL/min (ref >90)
GFR calc non Af Amer: >90 mL/min (ref >90)

## 2011-11-12 LAB — CBC
MCH: 31.2 pg (ref 26.0–34.0)
MCHC: 33.5 g/dL (ref 30.0–36.0)
MCV: 93.2 fL (ref 78.0–100.0)
Platelets: 233 10*3/uL (ref 150–400)
RDW: 14.3 % (ref 11.5–15.5)
WBC: 10.6 10*3/uL — ABNORMAL HIGH (ref 4.0–10.5)

## 2011-11-12 NOTE — Progress Notes (Signed)
Occupational Therapy Screen: Pt reports Right TKR in April 2012 and reports that she has necessary DME and will also have PRN assist from family s/p L TKR. She reports no further OT needs at this time therefore will sign off acute OT.

## 2011-11-12 NOTE — Progress Notes (Signed)
Physical Therapy Treatment Patient Details Name: Alison Long MRN: [id number] DOB: December 06, 1955 Today's Date: 11/12/2011 1411 - 1435; 2GT PT Assessment/Plan  PT - Assessment/Plan PT Plan: Discharge plan remains appropriate PT Frequency: 7X/week Follow Up Recommendations: Home health PT Equipment Recommended: None recommended by PT PT Goals  Acute Rehab PT Goals PT Goal Formulation: With patient Time For Goal Achievement: 7 days Pt will go Supine/Side to Sit: with supervision PT Goal: Supine/Side to Sit - Progress: Progressing toward goal Pt will go Sit to Supine/Side: with supervision PT Goal: Sit to Supine/Side - Progress: Progressing toward goal Pt will go Sit to Stand: with supervision PT Goal: Sit to Stand - Progress: Progressing toward goal Pt will go Stand to Sit: with supervision PT Goal: Stand to Sit - Progress: Progressing toward goal Pt will Ambulate: 51 - 150 feet;with supervision;with rolling walker PT Goal: Ambulate - Progress: Progressing toward goal Pt will Go Up / Down Stairs: 3-5 stairs;with supervision;with rolling walker PT Goal: Up/Down Stairs - Progress: Progressing toward goal  PT Treatment Precautions/Restrictions  Precautions Precautions: Knee Required Braces or Orthoses: Yes Knee Immobilizer: Discontinue once straight leg raise with < 10 degree lag Restrictions Weight Bearing Restrictions: No Other Position/Activity Restrictions: WBAT Mobility (including Balance) Bed Mobility Supine to Sit: 4: Min assist Supine to Sit Details (indicate cue type and reason): min assist with L LE Sit to Supine - Right: 4: Min assist Sit to Supine - Right Details (indicate cue type and reason): min assist with L LE Transfers Sit to Stand: 5: Supervision;From bed;From chair/3-in-1;With upper extremity assist;With armrests Sit to Stand Details (indicate cue type and reason): use of UEs Stand to Sit: 5: Supervision;With upper extremity assist;With armrests;To bed;To  chair/3-in-1 Stand to Sit Details: cues for LE position Ambulation/Gait Ambulation/Gait Assistance: 5: Supervision;4: Min assist Ambulation/Gait Assistance Details (indicate cue type and reason): cues for position from RW Ambulation Distance (Feet): 220 Feet Assistive device: Rolling walker Gait Pattern: Step-to pattern Stairs: Yes Stair Management Technique: One rail Right;Step to pattern;Forwards (HHA for support ) Number of Stairs: 2     Exercise  Total Joint Exercises Ankle Circles/Pumps: AROM;20 reps;Supine;Both Quad Sets: AROM;Both;20 reps;Supine Short Arc Quad: AAROM;AROM;10 reps;Supine;Both Heel Slides: AAROM;20 reps;Supine;Left Straight Leg Raises: AAROM;AROM;20 reps;Supine;Left End of Session PT - End of Session Activity Tolerance: Patient tolerated treatment well Patient left: in bed;with call bell in reach;with family/visitor present Nurse Communication: Mobility status for transfers;Mobility status for ambulation General Behavior During Session: [facility] for tasks performed Cognition: [facility] for tasks performed  Alison Long 11/12/2011, 3:25 PM

## 2011-11-12 NOTE — Progress Notes (Signed)
Subjective: Doing very well. Dressing changed and wound looks fine.   Objective: Vital signs in last 24 hours: Temp:  [98.9 F (37.2 C)-100.9 F (38.3 C)] 98.9 F (37.2 C) (12/16 0538) Pulse Rate:  [87-105] 87  (12/16 0538) Resp:  [14-24] 16  (12/16 0538) BP: (113-130)/(73-80) 113/77 mmHg (12/16 0538) SpO2:  [89 %-95 %] 95 % (12/16 0538)  Intake/Output from previous day: 12/15 0701 - 12/16 0700 In: 1160 [P.O.:1160] Out: 1350 [Urine:1350] Intake/Output this shift:     Basename 11/12/11 0530 11/11/11 0445  HGB 11.9* 12.8    Basename 11/12/11 0530 11/11/11 0445  WBC 10.6* 11.4*  RBC 3.81* 4.14  HCT 35.5* 38.1  PLT 233 289    Basename 11/12/11 0530 11/11/11 0445  NA 136 134*  K 3.9 3.8  CL 99 98  CO2 30 28  BUN 7 6  CREATININE 0.67 0.61  GLUCOSE 119* 134*  CALCIUM 9.4 9.3   No results found for this basename: LABPT:2,INR:2 in the last 72 hours  Dorsiflexion/Plantar flexion intact Calf is fine and no calf tenderness.  Assessment/Plan: DC Plans for Monday.   Consetta Cosner A 11/12/2011, 8:00 AM

## 2011-11-12 NOTE — Progress Notes (Signed)
Physical Therapy Treatment Patient Details Name: Alison Long MRN: [id number] DOB: 1956-01-22 Today's Date: 11/12/2011 0941 - 1017; GT, TE PT Assessment/Plan  PT - Assessment/Plan PT Plan: Discharge plan remains appropriate PT Frequency: 7X/week Follow Up Recommendations: Home health PT Equipment Recommended: None recommended by PT PT Goals  Acute Rehab PT Goals PT Goal Formulation: With patient Time For Goal Achievement: 7 days Pt will go Sit to Supine/Side: with supervision PT Goal: Sit to Supine/Side - Progress: Progressing toward goal Pt will go Sit to Stand: with supervision PT Goal: Sit to Stand - Progress: Progressing toward goal Pt will go Stand to Sit: with supervision PT Goal: Stand to Sit - Progress: Progressing toward goal Pt will Ambulate: 51 - 150 feet;with supervision;with rolling walker PT Goal: Ambulate - Progress: Progressing toward goal  PT Treatment Precautions/Restrictions  Precautions Precautions: Knee Required Braces or Orthoses: Yes Knee Immobilizer: Discontinue once straight leg raise with < 10 degree lag (PT performed SLR this am IND) Restrictions Weight Bearing Restrictions: No Other Position/Activity Restrictions: WBAT Mobility (including Balance) Bed Mobility Sit to Supine - Right: 4: Min assist Transfers Sit to Stand: 4: Min assist;With upper extremity assist;With armrests;From chair/3-in-1 Sit to Stand Details (indicate cue type and reason): cues for use of UEs Stand to Sit: 4: Min assist;To bed;With upper extremity assist Stand to Sit Details: cues for LE position Ambulation/Gait Ambulation/Gait Assistance: 4: Min assist Ambulation/Gait Assistance Details (indicate cue type and reason): cues for posture and position from RW Ambulation Distance (Feet): 148 Feet Assistive device: Rolling walker Gait Pattern: Step-to pattern    Exercise  Total Joint Exercises Ankle Circles/Pumps: AROM;20 reps;Supine;Both Quad Sets: AROM;Both;20  reps;Supine Short Arc Quad: AAROM;AROM;10 reps;Supine;Both Heel Slides: AAROM;20 reps;Supine;Left Straight Leg Raises: AAROM;AROM;20 reps;Supine;Left End of Session PT - End of Session Activity Tolerance: Patient tolerated treatment well Patient left: in bed;with call bell in reach;with family/visitor present Nurse Communication: Mobility status for transfers;Mobility status for ambulation General Behavior During Session: [facility] for tasks performed Cognition: [facility] for tasks performed  Alison Long 11/12/2011, 12:29 PM

## 2011-11-13 LAB — CBC
Platelets: 219 10*3/uL (ref 150–400)
RDW: 13.8 % (ref 11.5–15.5)
WBC: 9.5 10*3/uL (ref 4.0–10.5)

## 2011-11-13 MED ORDER — OXYCODONE HCL 5 MG PO TABS: 5.0000 mg | ORAL_TABLET | ORAL | Status: AC | PRN

## 2011-11-13 MED ORDER — BISACODYL 5 MG PO TBEC
5.0000 mg | DELAYED_RELEASE_TABLET | Freq: Every day | ORAL | Status: DC | PRN
  Administered 2011-11-12: 5 mg via ORAL

## 2011-11-13 MED ORDER — ENOXAPARIN SODIUM 30 MG/0.3ML ~~LOC~~ SOLN: 30.0000 mg | Freq: Two times a day (BID) | SUBCUTANEOUS | Status: DC

## 2011-11-13 MED ORDER — METHOCARBAMOL 500 MG PO TABS: 500.0000 mg | ORAL_TABLET | Freq: Four times a day (QID) | ORAL | Status: AC | PRN

## 2011-11-13 NOTE — Progress Notes (Signed)
Pt husband performed lovenox injection appropriately with RN supervision. Pt was on lovenox in April for right TKR and husband gave injections at that time. Both verbalize comfort and understanding of lovenox injections.

## 2011-11-13 NOTE — Progress Notes (Signed)
Subjective: No complaints Patient denies any problems she feels good is rated go home   Objective: Vital signs in last 24 hours: Temp:  [98.9 F (37.2 C)-99.9 F (37.7 C)] 99.9 F (37.7 C) (12/16 2041) Pulse Rate:  [106-110] 106  (12/16 2041) Resp:  [18] 18  (12/16 2041) BP: (113-126)/(70-82) 113/70 mmHg (12/16 2041) SpO2:  [93 %] 93 % (12/16 2041)  Intake/Output from previous day: 12/16 0701 - 12/17 0700 In: 720 [P.O.:720] Out: 1150 [Urine:1150] Intake/Output this shift: Total I/O In: 240 [P.O.:240] Out: 350 [Urine:350]   Basename 11/13/11 0410 11/12/11 0530 11/11/11 0445  HGB 10.9* 11.9* 12.8    Basename 11/13/11 0410 11/12/11 0530  WBC 9.5 10.6*  RBC 3.54* 3.81*  HCT 32.6* 35.5*  PLT 219 233    Basename 11/12/11 0530 11/11/11 0445  NA 136 134*  K 3.9 3.8  CL 99 98  CO2 30 28  BUN 7 6  CREATININE 0.67 0.61  GLUCOSE 119* 134*  CALCIUM 9.4 9.3   No results found for this basename: LABPT:2,INR:2 in the last 72 hours  Patient is conscious alert and appropriate sitting up getting ready to order her breakfast appears to be very comfortable. Left main wound is well approximated with Steri-Strips no erythema no drainage no signs of infection no blisters calf and thigh are soft and nontender her leg is neuromotor vascularly intact  Assessment/Plan: Postop day #3 the left total knee arthroplasty doing well Hyponatremia resolved next  Plan discharge to home with home health physical therapy CPM and Lovenox treatment with 2 week followup   Alison Long 11/13/2011, 6:51 AM

## 2011-11-13 NOTE — Discharge Summary (Signed)
Alison Long, POTTENGER NO.:  [id number]  MEDICAL RECORD NO.:  [id number]  LOCATION:  1620                         FACILITY:  [facility]  PHYSICIAN:  Erasmo Leventhal, M.D.DATE OF BIRTH:  10-Jul-1956  DATE OF ADMISSION:  11/10/2011 DATE OF DISCHARGE:  11/13/2011                              DISCHARGE SUMMARY   ADMISSION DIAGNOSES: 1. End-stage osteoarthritis, left knee. 2. Recent right total knee arthroplasty, doing well. 3. History of shingles. 4. History of urinary incontinence.  DISCHARGE DIAGNOSES: 1. Left total knee arthroplasty without complications. 2. Hyponatremia postoperative, resolved with fluid restrictions. 3. Postoperative anemia.  We will allow to self-correct with oral     supplementation, asymptomatic. 4. History of recent right total knee arthroplasty. 5. History of shingles. 6. History of urinary incontinence.  HISTORY OF PRESENT ILLNESS:  The patient is a 55 year old female well known to Dr. Thomasena Edis for treatment of bilateral knee end-stage osteoarthritis.  The patient had recent right total knee arthroplasty without any complications.  The patient failed conservative treatment with her left knee with degenerative arthritic changes, so the patient elected to proceed with a total knee arthroplasty on the left.  She was cleared prior to the surgery by her primary care physician, Dr. Holley Bouche.  ALLERGIES:  SULFA.  MEDICATIONS ON ADMISSION: 1. Integra Plus once a day. 2. Sertraline 50 mg a day. 3. Enablex once a day. 4. Glucosamine/chondroitin 2 tablets a day. 5. Fish oil 12,000 mg twice a day. 6. Calcium plus vitamin D 2 tablets a day. 7. Vitamin D3 of 1000 international units a day. 8. Vitamin B12 of 500 mg 2 tablets a day. 9. Aspirin 81 mg a day. 10.Vagifem 10 mcg twice a week. 11.Voltaren gel p.r.n. 12.Tylenol Arthritis as needed. 13.Vicodin as needed.  SURGICAL PROCEDURE:  On November 10, 2011, the patient was taken to  the OR by Dr. Valma Cava, assisted by Arlyn Leak, PA-C.  Under spinal and monitored anesthesia, the patient underwent a left total knee arthroplasty without any complications.  The patient had the following components implanted; a DePuy Sigma rotating platform, size 3 femur, size 2.5 tibial tray, a size 35 three-peg patella, and a 10-mm polyethylene insert.  The patient tolerated the procedure well.  There were no complications.  The patient was discharged from the OR to the PACU with 1 Hemovac drain left in place to follow total knee protocol.  CONSULTS:  The following routine consults were requested; Physical Therapy, Case Management.  HOSPITAL COURSE:  On November 10, 2011, the patient was admitted to [facility] under the care of Dr. Valma Cava.  The patient was taken to the OR where a left total knee arthroplasty was performed without any complications.  The patient was transferred to recovery room, then to orthopedic floor in good condition with IV antibiotics, pain medicines, Lovenox for DVT prophylaxis, to follow a total knee protocol.  The patient then incurred 3 days' postoperative course without any significant untoward events.  Postop day #1, her Hemovac drain was discontinued.  Her vital signs were stable.  Hemoglobin dropped to 12.8.  Her sodium dropped to 134.  The patient was asymptomatic.  Her IV  hydration was decreased.  The patient was out of bed with physical therapy and use of a CPM.  Postop day #2, the dressing was taken down.  No complications.  Vital signs stable.  Her IV fluids were discontinued.  She continued physical therapy.  Postop day #3, vital signs were stable.  She was afebrile.  The patient's hemoglobin did drop to 10.9.  Postop day #2, her sodium improved to 136.  Her wound was benign for any signs of infection.  It was well approximated with Steri-Strips.  No blisters.  Her calf and thigh were soft and nontender. Her leg was  neuromotor, vascularly intact.  She participated well with physical therapy and was ready for discharge home and she was discharged home in good condition with 2-week followup appointment.  LABS:  CBC on December 17th found WBCs 9.5, hemoglobin 10.9, hematocrit 32.6 with platelets 219.  Routine chemistries on December 16th found sodium of 136, potassium of 3.9.  BUN 7, creatinine 0.67.  DISCHARGE INSTRUCTIONS: 1. Diet:  The patient is to resume a normal routine diet. 2. Wound care.  The patient is to keep the wound clean and dry.  She     is to change dressing on a daily basis. 3. Activity:  She is to be weightbearing as tolerated.  Increase     activity with the use of a walker, instructed by Physical Therapy. 4. Followup:  She needs a followup appointment with Dr. Thomasena Edis in 2     weeks.  The patient is to call [phone] for that followup     appointment.  MEDICATIONS ON DISCHARGE: 1. Oxy IR 5 mg 1 to 3 tablets every 4 to 6 hours for pain if needed. 2. Lovenox 30 mg subcu injections every 12 hours for a total of 14     days. 3. Robaxin 500 mg 1 tablet every 6 hours for spasms. The patient is to resume routine home meds, to include; 1. Calcium carbonate with vitamin D. 2. Mepilex. 3. Vagifem. 4. Integra Plus. 5. Omega-3 fatty acids. 6. Sertraline 50 mg a day. 7. Vitamin B12. The patient is to hold her glucosamine/chondroitin and Norco.  CONDITION:  The patient's condition upon discharge to home is listed as improved and good.     Jamelle Rushing, P.A.   ______________________________ Erasmo Leventhal, M.D.    RWK/MEDQ  D:  11/13/2011  T:  11/13/2011  Job:  [id number]  cc:   Erasmo Leventhal, M.D. Fax: [phone]  Holley Bouche, M.D. Fax: [phone]

## 2011-11-13 NOTE — Discharge Summary (Signed)
  Discharge summary was dictated on the dictation line disc dictation [id number]

## 2011-11-13 NOTE — Progress Notes (Signed)
Physical Therapy Treatment Patient Details Name: RHEANNA SERGENT MRN: [id number] DOB: 1955/11/30 Today's Date: 11/13/2011  [phone] te g PT Assessment/Plan  PT - Assessment/Plan Comments on Treatment Session: Pt has met all goals and is ready for D/C.  Pt will recieve HHPT.   PT Plan: Discharge plan remains appropriate PT Frequency: 7X/week Follow Up Recommendations: Home health PT Equipment Recommended: None recommended by PT PT Goals  Acute Rehab PT Goals PT Goal Formulation: With patient Time For Goal Achievement: 7 days Pt will go Supine/Side to Sit: with supervision PT Goal: Supine/Side to Sit - Progress: Progressing toward goal Pt will go Sit to Supine/Side: with supervision PT Goal: Sit to Supine/Side - Progress: Met Pt will go Sit to Stand: with supervision PT Goal: Sit to Stand - Progress: Met Pt will go Stand to Sit: with supervision PT Goal: Stand to Sit - Progress: Met Pt will Ambulate: 51 - 150 feet;with supervision;with rolling walker PT Goal: Ambulate - Progress: Met Pt will Go Up / Down Stairs: 3-5 stairs;with supervision;with rolling walker PT Goal: Up/Down Stairs - Progress: Met (Pt used single crutch and rail)  PT Treatment Precautions/Restrictions  Precautions Precautions: Knee Required Braces or Orthoses: No Knee Immobilizer: Discontinue once straight leg raise with < 10 degree lag Restrictions Weight Bearing Restrictions: No Other Position/Activity Restrictions: WBAT Mobility (including Balance) Bed Mobility Bed Mobility: Yes Supine to Sit: 4: Min assist Supine to Sit Details (indicate cue type and reason): Min A with LLE  Sit to Supine - Left: 5: Supervision Transfers Transfers: Yes Sit to Stand: 5: Supervision;With upper extremity assist;From bed;From chair/3-in-1;With armrests Sit to Stand Details (indicate cue type and reason): Pt required VCs for pushing from bed with UE, placement of LLE Stand to Sit: 5: Supervision;With upper extremity  assist;With armrests;To bed;To chair/3-in-1 Stand to Sit Details: cues for LE position Ambulation/Gait Ambulation/Gait: Yes Ambulation/Gait Assistance: 5: Supervision Ambulation/Gait Assistance Details (indicate cue type and reason): Cues for RW placement, correct posture Ambulation Distance (Feet): 100 Feet Assistive device: Rolling walker Gait Pattern: Step-to pattern Gait velocity: decreased gait velocity Stairs: Yes Stairs Assistance: 3: Mod assist Stairs Assistance Details (indicate cue type and reason): Required cues for sequencing and use of single crutch  Stair Management Technique: One rail Right;Step to pattern;Forwards;With crutches (Use of single crutch in LUE) Number of Stairs: 4  Height of Stairs: 6     Exercise  Total Joint Exercises Ankle Circles/Pumps: AROM;5 reps;Supine;Left Quad Sets: AROM;Left;5 reps;Supine Heel Slides: AROM;Left;5 reps;Supine Hip ABduction/ADduction: AROM;Left;5 reps;Supine Straight Leg Raises: AAROM;Left;5 reps;Supine End of Session PT - End of Session Activity Tolerance: Patient tolerated treatment well Patient left: in bed;with call bell in reach;with family/visitor present Nurse Communication:  (Nurse notified pt ready for D/C) General Behavior During Session: [facility] for tasks performed Cognition: [facility] for tasks performed  Page, Meribeth Mattes 11/13/2011, 10:22 AM

## 2011-11-13 NOTE — Progress Notes (Signed)
D/C instructions reviewed w/ pt and husband. Both verbalize understanding, all questions answered, no further questions. Pt d/c in w/c in stable condition by NT. Pt in possession of d/c instructions, scripts, and all personal belongings.

## 2011-11-13 NOTE — Progress Notes (Signed)
Utilization review completed.  

## 2011-11-13 NOTE — Progress Notes (Signed)
11/13/2011 Raynelle Bring BSN CCM [phone] CM consult-Pt plans to go back to her home where her mother and spouse will be caregivers. Already has DME-RW, cane, [place] seat, Crutches. Has used [place] for [facility] services and wishes to use again. [facility] can provide services and will start services tomorrow 11/14/2011

## 2011-11-15 ENCOUNTER — Encounter (HOSPITAL_COMMUNITY): Payer: Self-pay | Admitting: Specialist

## 2012-01-17 ENCOUNTER — Other Ambulatory Visit: Payer: Self-pay | Admitting: Obstetrics and Gynecology

## 2012-01-17 DIAGNOSIS — Z1231 Encounter for screening mammogram for malignant neoplasm of breast: Secondary | ICD-10-CM

## 2012-02-13 ENCOUNTER — Ambulatory Visit
Admission: RE | Admit: 2012-02-13 | Discharge: 2012-02-13 | Disposition: A | Discharge status: Home / Self Care | Payer: Federal, State, Local not specified - PPO | Source: Ambulatory Visit | Attending: Obstetrics and Gynecology | Admitting: Obstetrics and Gynecology

## 2012-02-13 DIAGNOSIS — Z1231 Encounter for screening mammogram for malignant neoplasm of breast: Secondary | ICD-10-CM

## 2012-03-26 ENCOUNTER — Telehealth: Payer: Self-pay | Admitting: Obstetrics and Gynecology

## 2012-03-26 NOTE — Telephone Encounter (Signed)
Denied [facility] from [facility] for [facility]ertraline hcl 50mg  On 01/04/12. Pt got rx refill sertraline 50 mg sid 1 po qd disp 90 with 3 refill. bt cma

## 2012-04-03 ENCOUNTER — Other Ambulatory Visit: Payer: Federal, State, Local not specified - PPO

## 2012-04-03 DIAGNOSIS — D509 Iron deficiency anemia, unspecified: Secondary | ICD-10-CM

## 2012-04-03 LAB — HEMOGLOBIN: Hemoglobin: 13.4 g/dL (ref 12.0–15.0)

## 2012-12-02 ENCOUNTER — Telehealth: Payer: Self-pay | Admitting: Obstetrics and Gynecology

## 2012-12-02 NOTE — Telephone Encounter (Signed)
Spoke with pt rgdg telephone call. Pt c/o inc burning with urination x 2wks. No fever. Pt wants to see [facility]. Next available appt 12/05/12. Informed pt not to wait til that date. Pt to follow up with urgent care facility or PCP asap. Pt voices understanding.

## 2013-01-07 ENCOUNTER — Encounter: Payer: Self-pay | Admitting: Obstetrics and Gynecology

## 2013-01-07 ENCOUNTER — Ambulatory Visit: Payer: Federal, State, Local not specified - PPO | Admitting: Obstetrics and Gynecology

## 2013-01-07 VITALS — BP 104/72 | Ht 62.0 in | Wt 174.0 lb

## 2013-01-07 DIAGNOSIS — Z124 Encounter for screening for malignant neoplasm of cervix: Secondary | ICD-10-CM

## 2013-01-07 DIAGNOSIS — N8189 Other female genital prolapse: Secondary | ICD-10-CM

## 2013-01-07 DIAGNOSIS — N3281 Overactive bladder: Secondary | ICD-10-CM

## 2013-01-07 NOTE — Progress Notes (Signed)
Subjective:  Last Pap: 01/04/2012  WNL: Yes Regular Periods:no Contraception: Husband had vasectomy  Monthly Breast exam:yes Tetanus<40yrs:no Nl.Bladder Function:yes Bladder dropped  Daily BMs:yes Healthy Diet:yes Calcium:yes Mammogram:yes Date of Mammogram: 02/13/2012 Exercise:yes Have often Exercise: three times weekly just joined gym Seatbelt: yes Abuse at home: no Stressful work:yes Sigmoid-colonoscopy: per pt 2010  Bone Density: No PCP: Dr Holley Bouche  Change in PMH: none Change in GNF:AOZH   Alison Long is a 57 y.o. female G2P2002 who presents for annual exam.  The patient has no complaints today.   The following portions of the patient's history were reviewed and updated as appropriate: allergies, current medications, past family history, past medical history, past social history, past surgical history and problem list.  Review of Systems Pertinent items are noted in HPI. Gastrointestinal:No change in bowel habits, no abdominal pain, no rectal bleeding Genitourinary:negative for dysuria, frequency, hematuria, nocturia and urinary incontinence    Objective:     BP 104/72  Ht 5\' 2"  (1.575 m)  Wt 174 lb (78.926 kg)  BMI 31.82 kg/m2  LMP 03/16/2010  Weight:  Wt Readings from Last 1 Encounters:  01/07/13 174 lb (78.926 kg)     BMI: Body mass index is 31.82 kg/(m^2). General Appearance: Alert, appropriate appearance for age. No acute distress HEENT: Grossly normal Neck / Thyroid: Supple, no masses, nodes or enlargement Lungs: clear to auscultation bilaterally Back: No CVA tenderness Breast Exam: No masses or nodes.No dimpling, nipple retraction or discharge. Cardiovascular: Regular rate and rhythm. S1, S2, no murmur Gastrointestinal: Soft, non-tender, no masses or organomegaly Pelvic Exam: External genitalia: normal general appearance Vaginal: atrophic mucosa Cervix: normal appearance and minimal descensus Adnexa: no masses Uterus: normal single,  nontender Rectovaginal: normal rectal, no masses Lymphatic Exam: Non-palpable nodes in neck, clavicular, axillary, or inguinal regions Skin: no rash or abnormalities Neurologic: Normal gait and speech, no tremor  Psychiatric: Alert and oriented, appropriate affect.    Urinalysis:Not done    Assessment:    Now asymptomatic pelvic relaxation  OAB well controlled with Enablex and vagifem    Plan:   mammogram return annually or prn Continue Vagifem and Enablex    Dierdre Forth  MD

## 2013-04-11 ENCOUNTER — Other Ambulatory Visit: Payer: Self-pay

## 2013-04-11 DIAGNOSIS — Z1231 Encounter for screening mammogram for malignant neoplasm of breast: Secondary | ICD-10-CM

## 2013-05-01 ENCOUNTER — Ambulatory Visit
Admission: RE | Admit: 2013-05-01 | Discharge: 2013-05-01 | Disposition: A | Discharge status: Home / Self Care | Payer: Federal, State, Local not specified - PPO | Source: Ambulatory Visit

## 2013-05-01 ENCOUNTER — Ambulatory Visit: Payer: Federal, State, Local not specified - PPO

## 2013-05-01 DIAGNOSIS — Z1231 Encounter for screening mammogram for malignant neoplasm of breast: Secondary | ICD-10-CM

## 2014-02-16 ENCOUNTER — Other Ambulatory Visit: Payer: Self-pay | Admitting: Family Medicine

## 2014-02-16 ENCOUNTER — Ambulatory Visit
Admission: RE | Admit: 2014-02-16 | Discharge: 2014-02-16 | Disposition: A | Discharge status: Home / Self Care | Payer: Federal, State, Local not specified - PPO | Source: Ambulatory Visit | Attending: Family Medicine | Admitting: Family Medicine

## 2014-02-16 DIAGNOSIS — R0602 Shortness of breath: Secondary | ICD-10-CM

## 2014-06-09 ENCOUNTER — Other Ambulatory Visit: Payer: Self-pay

## 2014-06-09 DIAGNOSIS — Z1231 Encounter for screening mammogram for malignant neoplasm of breast: Secondary | ICD-10-CM

## 2014-06-17 ENCOUNTER — Encounter (INDEPENDENT_AMBULATORY_CARE_PROVIDER_SITE_OTHER): Payer: Self-pay

## 2014-06-17 ENCOUNTER — Ambulatory Visit
Admission: RE | Admit: 2014-06-17 | Discharge: 2014-06-17 | Disposition: A | Discharge status: Home / Self Care | Payer: Federal, State, Local not specified - PPO | Source: Ambulatory Visit

## 2014-06-17 DIAGNOSIS — Z1231 Encounter for screening mammogram for malignant neoplasm of breast: Secondary | ICD-10-CM

## 2014-08-27 ENCOUNTER — Encounter: Payer: Self-pay | Admitting: Internal Medicine

## 2014-08-28 ENCOUNTER — Ambulatory Visit (INDEPENDENT_AMBULATORY_CARE_PROVIDER_SITE_OTHER): Payer: Federal, State, Local not specified - PPO | Admitting: Internal Medicine

## 2014-08-28 ENCOUNTER — Encounter: Payer: Self-pay | Admitting: Internal Medicine

## 2014-08-28 ENCOUNTER — Encounter (INDEPENDENT_AMBULATORY_CARE_PROVIDER_SITE_OTHER): Payer: Self-pay

## 2014-08-28 VITALS — BP 120/84 | HR 75 | Ht 63.0 in | Wt 186.0 lb

## 2014-08-28 DIAGNOSIS — R053 Chronic cough: Secondary | ICD-10-CM

## 2014-08-28 DIAGNOSIS — R05 Cough: Secondary | ICD-10-CM

## 2014-08-28 MED ORDER — BENZONATATE 200 MG PO CAPS: 200.0000 mg | ORAL_CAPSULE | Freq: Three times a day (TID) | ORAL | Status: DC | PRN

## 2014-08-28 NOTE — Progress Notes (Signed)
Subjective:    Patient ID: Alison Long, female    DOB: 1955-12-02, 58 y.o.   MRN: [id number] PCP - Holley Bouche with [facility]  HPI  IOV 08/28/2014  Chief Complaint  Patient presents with  . Pulmonary Consult    Pt referred by Dr. Tiburcio Pea for chronic cough. Pt states c/o prod cough with clear mucus x 1 year.     58 year old female. Approix 1 year ago developed cough. Now chronic. Does not remember details but suspects might have followed a sinus infection but she is not sure. Cough is moderate to severe in intensity. SOme days good and some days bad but is there every day. Thinks might be progressive past few months. Associted with sputum but has to cough it out deep and hard, and tickle and tightness in chest and hoarse voice. No clear cut precipitating factor - happens sponatneosuly but consistently triggered by heavy meals and also periodically when she lies down. ALbuterol mdi seems to help partially. REcent 3 month trial of ppi has helped some but not fully effective (noted to be on fish oil for years), Denies wheeze or dyspnea or syncope. RSI cough score is 24 and c/w LPR cough   Cough relevant hx   Sius and Allergy: she has mild spring allergy which she treats with OTC anti-histamine.  Denies daily post nasal drip. Denies alergy or ENT eval   ACE inhibitor intake: denies   Airway hx/Pulmonar hx: no dx of asthma or allergies or ILD. CT chest 2012 showed post op LLL pneumonia but a cxr spring 2015 was clear. Alb mdi helps cough somewhat   GERD: She is on scheduled PPI and TUMS for GERD. I do notce she is in [facility]  OIL as well   EXposure hx: she is a non smoker   Others: Works in [employer]. Spends all day talking on phone but does not think this a voice stress    Tests  - CXR march 2015 is clear - CT chest 2012 - had fever aftrer knee surgery - showed LLL pneumonia     Dr Gretta Cool Reflux Symptom Index (> 13-15 suggestive of LPR cough) 0 -> 5  =  none ->severe  problem 08/28/2014   Hoarseness of problem with voice 3  Clearing  Of Throat 3  Excess throat mucus or feeling of post nasal drip 4  Difficulty swallowing food, liquid or tablets 0  Cough after eating or lying down 5  Breathing difficulties or choking episodes 0  Troublesome or annoying cough 5  Sensation of something sticking in throat or lump in throat 2  Heartburn, chest pain, indigestion, or stomach acid coming up 2  TOTAL 24       has a past medical history of PONV (postoperative nausea and vomiting); Anemia; Chronic kidney disease; Arthritis; Depression; Osteoarthritis; Anxiety; Overactive bladder; Carpal tunnel syndrome; and Chronic cough.    reports that she has never smoked. She has never used smokeless tobacco.    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   Allergies  Allergen Reactions  . Sulfa Antibiotics Hives    Current outpatient prescriptions:aspirin 81 MG tablet, Take 81 mg by mouth daily., Disp: , Rfl: ;  Calcium Carbonate-Vitamin D (CALCIUM 600+D HIGH POTENCY) 600-400 MG-UNIT per tablet, Take 1 tablet by mouth 2 (two) times daily. , Disp: , Rfl: ;  glucosamine-chondroitin 500-400 MG tablet, Take 1 tablet by mouth 3 (three) times daily.,  Disp: , Rfl:  Omega-3 Fatty Acids (FISH OIL) 1200 MG CAPS, Take 1 capsule by mouth 2 (two) times daily. , Disp: , Rfl: ;  sertraline (ZOLOFT) 50 MG tablet, Take 50 mg by mouth at bedtime. , Disp: , Rfl: ;  vitamin B-12 (CYANOCOBALAMIN) 500 MCG tablet, Take 1,000 mcg by mouth daily. , Disp: , Rfl: ;  pantoprazole (PROTONIX) 40 MG tablet, Take 1 tablet by mouth daily., Disp: , Rfl:    Review of Systems  Constitutional: Negative for fever and unexpected weight change.  HENT: Negative for congestion, dental problem, ear pain, nosebleeds, postnasal drip, rhinorrhea, sinus pressure, sneezing, sore throat and trouble swallowing.   Eyes: Negative for  redness and itching.  Respiratory: Positive for cough. Negative for chest tightness, shortness of breath and wheezing.   Cardiovascular: Negative for palpitations and leg swelling.  Gastrointestinal: Negative for nausea and vomiting.  Genitourinary: Negative for dysuria.  Musculoskeletal: Negative for joint swelling.  Skin: Negative for rash.  Neurological: Negative for headaches.  Hematological: Does not bruise/bleed easily.  Psychiatric/Behavioral: Negative for dysphoric mood. The patient is not nervous/anxious.        Objective:   Physical Exam  Vitals reviewed. Constitutional: She is oriented to person, place, and time. She appears well-developed and well-nourished. No distress.  Body mass index is 32.96 kg/(m^2).   HENT:  Head: Normocephalic and atraumatic.  Right Ear: External ear normal.  Left Ear: External ear normal.  Mouth/Throat: Oropharynx is clear and moist. No oropharyngeal exudate.  Periodically coughs Hoarse voice Mild nasal sniffles _ Mild post nasal drip + Irritated hypopharynx +  Eyes: Conjunctivae and EOM are normal. Pupils are equal, round, and reactive to light. Right eye exhibits no discharge. Left eye exhibits no discharge. No scleral icterus.  Neck: Normal range of motion. Neck supple. No JVD present. No tracheal deviation present. No thyromegaly present.  Cardiovascular: Normal rate, regular rhythm, normal heart sounds and intact distal pulses.  Exam reveals no gallop and no friction rub.   No murmur heard. Pulmonary/Chest: Effort normal and breath sounds normal. No respiratory distress. She has no wheezes. She has no rales. She exhibits no tenderness.  Abdominal: Soft. Bowel sounds are normal. She exhibits no distension and no mass. There is no tenderness. There is no rebound and no guarding.  Musculoskeletal: Normal range of motion. She exhibits no edema and no tenderness.  Lymphadenopathy:    She has no cervical adenopathy.  Neurological: She is  alert and oriented to person, place, and time. She has normal reflexes. No cranial nerve deficit. She exhibits normal muscle tone. Coordination normal.  Skin: Skin is warm and dry. No rash noted. She is not diaphoretic. No erythema. No pallor.  Psychiatric: She has a normal mood and affect. Her behavior is normal. Judgment and thought content normal.    Filed Vitals:   08/28/14 1404  Height: 5\' 3"  (1.6 m)  Weight: 186 lb (84.369 kg)   Filed Vitals:   08/28/14 1404  BP: 120/84  Pulse: 75  Height: 5\' 3"  (1.6 m)  Weight: 186 lb (84.369 kg)  SpO2: 94%         Assessment & Plan:  Cough is possibly  from sinus drainage, acid reflux, and possible asthma All of this is working together to cause cyclical cough/LPR cough  #Sinus drainage  - start/continue netti pot daily atleast 3 times per week -- start nasal steroid generic fluticasone inhaler 2 squirts each nostril daily as advised (nurse will do script)   #  Possible Acid Reflux  -continue protonix daily - BUT TAKE ON EMPTY STOMACH   - - avoid colas, spices, cheeses, spirits, red meats, beer, chocolates, fried foods etc.,  - take LOW GLYCEMIC DIET SHEE FROM [place] - eat only foods in left lane  - sleep with head end of bed elevated  - eat small frequent meals  - do not go to bed for 3 hours after last meal  #Possible Asthma  - do methacholine challenge test (you cannot be pregnant or have heart disease for this test) - till results back use albuterol as needed   #Cyclical cough  - please choose 2-3 days and observe complete voice rest - no talking or whispering  - at all times there  there is urge to cough, drink water or swallow or sip on throat lozenge - take Tessalon Cough perles 200mg  three times daily as needed   #Others  - High Resolution CT chest without contrast on ILD protocol for chronic cough   #Followup - Follow with Dr Marchelle Gearing or NP Tammy Parrett in 7-14 days - any problems call or come sooner - consider  neurontin and speech Rx at followup depending on response   Dr. Kalman Shan, M.D., [facility].C.P Pulmonary and Critical Care Medicine Staff Physician [facility] Pager: [phone], If no answer or between  15:00h - 7:00h: call [phone]  08/28/2014 5:13 PM

## 2014-08-28 NOTE — Patient Instructions (Signed)
Cough is possibly  from sinus drainage, acid reflux, and possible asthma All of this is working together to cause cyclical cough/LPR cough  #Sinus drainage  - start/continue netti pot daily atleast 3 times per week -- start nasal steroid generic fluticasone inhaler 2 squirts each nostril daily as advised (nurse will do script)   #Possible Acid Reflux  -continue protonix daily - BUT TAKE ON EMPTY STOMACH   - - avoid colas, spices, cheeses, spirits, red meats, beer, chocolates, fried foods etc.,  - take LOW GLYCEMIC DIET SHEE FROM [place] - eat only foods in left lane  - sleep with head end of bed elevated  - eat small frequent meals  - do not go to bed for 3 hours after last meal  #Possible Asthma  - do methacholine challenge test (you cannot be pregnant or have heart disease for this test) - till results back use albuterol as needed   #Cyclical cough  - please choose 2-3 days and observe complete voice rest - no talking or whispering  - at all times there  there is urge to cough, drink water or swallow or sip on throat lozenge - take Tessalon Cough perles 200mg  three times daily as needed  #Others  - High Resolution CT chest without contrast on ILD protocol for chronic cough   #Followup - Follow with Dr Marchelle Gearingamaswamy or NP Tammy Parrett in 7-14 days - any problems call or come sooner

## 2014-09-03 ENCOUNTER — Ambulatory Visit (HOSPITAL_COMMUNITY)
Admission: RE | Admit: 2014-09-03 | Discharge: 2014-09-03 | Disposition: A | Discharge status: Home / Self Care | Payer: Federal, State, Local not specified - PPO | Source: Ambulatory Visit | Attending: Internal Medicine | Admitting: Internal Medicine

## 2014-09-03 ENCOUNTER — Ambulatory Visit (INDEPENDENT_AMBULATORY_CARE_PROVIDER_SITE_OTHER)
Admission: RE | Admit: 2014-09-03 | Discharge: 2014-09-03 | Disposition: A | Discharge status: Home / Self Care | Payer: Federal, State, Local not specified - PPO | Source: Ambulatory Visit | Attending: Internal Medicine | Admitting: Internal Medicine

## 2014-09-03 DIAGNOSIS — R053 Chronic cough: Secondary | ICD-10-CM

## 2014-09-03 DIAGNOSIS — R05 Cough: Secondary | ICD-10-CM | POA: Diagnosis not present

## 2014-09-03 LAB — PULMONARY FUNCTION TEST
FEF 25-75 POST: 3.32 L/s
FEF 25-75 PRE: 3.26 L/s
FEF2575-%CHANGE-POST: 1 %
FEF2575-%PRED-POST: 134 %
FEF2575-%Pred-Pre: 132 %
FEV1-%Change-Post: 0 %
FEV1-%PRED-POST: 102 %
FEV1-%Pred-Pre: 101 %
FEV1-POST: 2.62 L
FEV1-PRE: 2.6 L
FEV1FVC-%CHANGE-POST: 2 %
FEV1FVC-%PRED-PRE: 106 %
FEV6-%CHANGE-POST: -1 %
FEV6-%PRED-PRE: 97 %
FEV6-%Pred-Post: 95 %
FEV6-Post: 3.03 L
FEV6-Pre: 3.09 L
FEV6FVC-%PRED-POST: 103 %
FEV6FVC-%Pred-Pre: 103 %
FVC-%CHANGE-POST: -1 %
FVC-%PRED-POST: 92 %
FVC-%Pred-Pre: 93 %
FVC-PRE: 3.09 L
FVC-Post: 3.03 L
PRE FEV1/FVC RATIO: 84 %
Post FEV1/FVC ratio: 86 %
Post FEV6/FVC ratio: 100 %
Pre FEV6/FVC Ratio: 100 %

## 2014-09-03 MED ORDER — METHACHOLINE 4 MG/ML NEB SOLN
2.0000 mL | Freq: Once | RESPIRATORY_TRACT | Status: AC
  Administered 2014-09-03: 8 mg via RESPIRATORY_TRACT

## 2014-09-03 MED ORDER — SODIUM CHLORIDE 0.9 % IN NEBU
3.0000 mL | INHALATION_SOLUTION | Freq: Once | RESPIRATORY_TRACT | Status: AC
  Administered 2014-09-03: 3 mL via RESPIRATORY_TRACT

## 2014-09-03 MED ORDER — METHACHOLINE 16 MG/ML NEB SOLN
2.0000 mL | Freq: Once | RESPIRATORY_TRACT | Status: AC
  Administered 2014-09-03: 32 mg via RESPIRATORY_TRACT

## 2014-09-03 MED ORDER — METHACHOLINE 0.0625 MG/ML NEB SOLN
2.0000 mL | Freq: Once | RESPIRATORY_TRACT | Status: AC
  Administered 2014-09-03: 0.125 mg via RESPIRATORY_TRACT

## 2014-09-03 MED ORDER — ALBUTEROL SULFATE (2.5 MG/3ML) 0.083% IN NEBU
2.5000 mg | INHALATION_SOLUTION | Freq: Once | RESPIRATORY_TRACT | Status: AC
  Administered 2014-09-03: 2.5 mg via RESPIRATORY_TRACT

## 2014-09-03 MED ORDER — METHACHOLINE 1 MG/ML NEB SOLN
2.0000 mL | Freq: Once | RESPIRATORY_TRACT | Status: AC
  Administered 2014-09-03: 2 mg via RESPIRATORY_TRACT

## 2014-09-03 MED ORDER — METHACHOLINE 0.25 MG/ML NEB SOLN
2.0000 mL | Freq: Once | RESPIRATORY_TRACT | Status: AC
  Administered 2014-09-03: 0.5 mg via RESPIRATORY_TRACT

## 2014-09-07 ENCOUNTER — Encounter: Payer: Self-pay | Admitting: Internal Medicine

## 2014-09-07 ENCOUNTER — Ambulatory Visit (INDEPENDENT_AMBULATORY_CARE_PROVIDER_SITE_OTHER): Payer: Federal, State, Local not specified - PPO | Admitting: Internal Medicine

## 2014-09-07 VITALS — BP 130/88 | HR 74 | Ht 63.0 in | Wt 181.6 lb

## 2014-09-07 DIAGNOSIS — I2510 Atherosclerotic heart disease of native coronary artery without angina pectoris: Secondary | ICD-10-CM

## 2014-09-07 DIAGNOSIS — K449 Diaphragmatic hernia without obstruction or gangrene: Secondary | ICD-10-CM

## 2014-09-07 DIAGNOSIS — R05 Cough: Secondary | ICD-10-CM

## 2014-09-07 DIAGNOSIS — R053 Chronic cough: Secondary | ICD-10-CM

## 2014-09-07 MED ORDER — BENZONATATE 200 MG PO CAPS: 200.0000 mg | ORAL_CAPSULE | Freq: Three times a day (TID) | ORAL | Status: AC | PRN

## 2014-09-07 NOTE — Patient Instructions (Addendum)
#  Coronary Arter Calcification - seen on CT 10.8/15  - refer cardiology for stress test  #Small Hiatal Hernia CT chest - Oct 2015  - absolutely important to follow a good diet and GERD treatment - if worried, can have pcp Johny BlamerHARRIS, WILLIAM, MD refer you to GI   #Chronic Cough  - Cough is y  from sinus drainage, and  acid reflux, All of this is working together to cause cyclical cough/LPR cough  - Asthma and ILD have have been ruled out  - Glad you are significantly better   - For   - Sinus drainage    - continue netti pot daily atleast 3 times per week   -- continue nasal steroid generic fluticasone inhaler 2 squirts each nostril daily as advised   -  Acid Reflux    -continue protonix daily - BUT TAKE ON EMPTY STOMACH     - - avoid colas, spices, cheeses, spirits, red meats, beer, chocolates, fried foods etc.,    - take LOW GLYCEMIC DIET SHEE FROM [place] - eat only foods in left lane    - sleep with head end of bed elevated    - eat small frequent meals    - do not go to bed for 3 hours after last meal  #Cyclical cough  - please choose 2-3 days and observe complete voice rest - no talking or whispering  - at all times there  there is urge to cough, drink water or swallow or sip on throat lozenge - take Tessalon Cough perles 200mg  three times daily as needed - if these measures do no work, will refer you to speech therapy and start you on neurontin/lyrica    #Followup - Follow with Dr Marchelle Gearingamaswamy or NP Tammy Parrett in 6 weeks - any problems call or come sooner

## 2014-09-07 NOTE — Progress Notes (Signed)
Subjective:    Patient ID: Alison Long, female    DOB: 01/08/1956, 58 y.o.   MRN: [id number]  HPI  PCP - Holley Bou[facility]all [facility]  HPI  IOV 08/28/2014  Chief Complaint  Patient presents with  . Pulmonary Consult    Pt referred by Dr. Tiburcio PeaHarris for chronic cough. Pt states c/o prod cough with clear mucus x 1 year.     58 year old female. Approix 1 year ago developed cough. Now chronic. Does not remember details but suspects might have followed a sinus infection but she is not sure. Cough is moderate to severe in intensity. SOme days good and some days bad but is there every day. Thinks might be progressive past few months. Associted with sputum but has to cough it out deep and hard, and tickle and tightness in chest and hoarse voice. No clear cut precipitating factor - happens sponatneosuly but consistently triggered by heavy meals and also periodically when she lies down. ALbuterol mdi seems to help partially. REcent 3 month trial of ppi has helped some but not fully effective (noted to be on fish oil for years), Denies wheeze or dyspnea or syncope. RSI cough score is 24 and c/w LPR cough   Cough relevant hx   Sius and Allergy: she has mild spring allergy which she treats with OTC anti-histamine.  Denies daily post nasal drip. Denies alergy or ENT eval   ACE inhibitor intake: denies   Airway hx/Pulmonar hx: no dx of asthma or allergies or ILD. CT chest 2012 showed post op LLL pneumonia but a cxr spring 2015 was clear. Alb mdi helps cough somewhat   GERD: She is on scheduled PPI and TUMS for GERD. I do notce [facility]   EXposure hx: she is a non smoker   Others: Works in [employer]. Spends all day talking on phone but does not think this a voice stress    Tests  - CXR march 2015 is clear - CT chest 2012 - had fever aftrer knee surgery - showed LLL pneumonia  REC Cough is possibly  from sinus drainage, acid reflux, and possible asthma All of this is working  together to cause cyclical cough/LPR cough  #Sinus drainage  - start/continue netti pot daily atleast 3 times per week -- start nasal steroid generic fluticasone inhaler 2 squirts each nostril daily as advised (nurse will do script)   #Possible Acid Reflux  -continue protonix daily - BUT TAKE ON EMPTY STOMACH   - - avoid colas, spices, cheeses, spirits, red meats, beer, chocolates, fried foods etc.,  - take LOW GLYCEMIC DIET SHEE FROM [place] - eat only foods in left lane  - sleep with head end of bed elevated  - eat small frequent meals  - do not go to bed for 3 hours after last meal  #Possible Asthma  - do methacholine challenge test (you cannot be pregnant or have heart disease for this test) - till results back use albuterol as needed   #Cyclical cough  - please choose 2-3 days and observe complete voice rest - no talking or whispering  - at all times there  there is urge to cough, drink water or swallow or sip on throat lozenge - take Tessalon Cough perles 200mg  three times daily as needed  #Others  - High Resolution CT chest without contrast on ILD protocol for chronic cough   #Followup - Follow with Dr Marchelle Gearingamaswamy or NP Babette Relicammy  Parrett in 7-14 days - any problems call or come sooner   OV 09/07/2014  Chief Complaint  Patient presents with  . Follow-up    Pt here after methacholine challenge and HRCT. Pt states her cough has improved since last OV. Pt states she has strange pain under right breast, tight pain. Pt denies SOB and CP/tightness.      FU chronic cough. With sinus and GERD rx she is 25% better. RSI cough score down to 16. She has lost 5# on low glycemic diet that is helping her cough and gerd. CT chest is free of ILD or nodules but shows small hiatal hernia (her mom is s/p nissen). Methacholine challenge test 09/03/14 is negative for asthma. She has been unable to do voice rest.  Of note, she has had some non specific RUQ pain intermittent and mild and dull since  starting low glycemic diet; referred to Elizabeth Sauer, MD   Past, Family, Social reviewed:   - she has co art calcification on CT oct 2015 - no smoking hx. No chest pain. No fmaily hx of cad - going to be grand mom soon     Dr Gretta Cool Reflux Symptom Index (> 13-15 suggestive of LPR cough)  08/28/2014  09/07/2014   Hoarseness of problem with voice 3 2  Clearing  Of Throat 3 2  Excess throat mucus or feeling of post nasal drip 4 3  Difficulty swallowing food, liquid or tablets 0 0  Cough after eating or lying down 5 3  Breathing difficulties or choking episodes 0 0  Troublesome or annoying cough 5 3  Sensation of something sticking in throat or lump in throat 2 0  Heartburn, chest pain, indigestion, or stomach acid coming up 2 3  TOTAL 24 16  Interventions done at end of visit Sinus Rx, GERD rx,low glycemic diet,  methacholine test and CT chest Continue sinus Rx, gerd rx, low glycemic diet and emphasized voice rest     has a past medical history of PONV (postoperative nausea and vomiting); Anemia; Chronic kidney disease; Arthritis; Depression; Osteoarthritis; Anxiety; Overactive bladder; Carpal tunnel syndrome; and Chronic cough.   Review of Systems  Constitutional: Negative for fever and unexpected weight change.  HENT: Negative for congestion, dental problem, ear pain, nosebleeds, postnasal drip, rhinorrhea, sinus pressure, sneezing, sore throat and trouble swallowing.   Eyes: Negative for redness and itching.  Respiratory: Positive for cough. Negative for chest tightness, shortness of breath and wheezing.   Cardiovascular: Negative for palpitations and leg swelling.  Gastrointestinal: Negative for nausea and vomiting.  Genitourinary: Negative for dysuria.  Musculoskeletal: Negative for joint swelling.  Skin: Negative for rash.  Neurological: Negative for headaches.  Hematological: Does not bruise/bleed easily.  Psychiatric/Behavioral: Negative for dysphoric mood. The  patient is not nervous/anxious.    Current outpatient prescriptions:aspirin 81 MG tablet, Take 81 mg by mouth daily., Disp: , Rfl: ;  benzonatate (TESSALON) 200 MG capsule, Take 1 capsule (200 mg total) by mouth 3 (three) times daily as needed for cough., Disp: 90 capsule, Rfl: 1;  Calcium Carbonate-Vitamin D (CALCIUM 600+D HIGH POTENCY) 600-400 MG-UNIT per tablet, Take 1 tablet by mouth 2 (two) times daily. , Disp: , Rfl:  glucosamine-chondroitin 500-400 MG tablet, Take 1 tablet by mouth 3 (three) times daily., Disp: , Rfl: ;  pantoprazole (PROTONIX) 40 MG tablet, Take 1 tablet by mouth daily., Disp: , Rfl: ;  sertraline (ZOLOFT) 50 MG tablet, Take 50 mg by mouth at bedtime. , Disp: ,  Rfl: ;  vitamin B-12 (CYANOCOBALAMIN) 500 MCG tablet, Take 1,000 mcg by mouth daily. , Disp: , Rfl:      Objective:   Physical Exam  Vitals reviewed. Constitutional: She is oriented to person, place, and time. She appears well-developed and well-nourished. No distress.  Estimated body mass index is 32.18 kg/(m^2) as calculated from the following:   Height as of this encounter: 5\' 3"  (1.6 m).   Weight as of this encounter: 181 lb 9.6 oz (82.373 kg).   HENT:  Head: Normocephalic and atraumatic.  Right Ear: External ear normal.  Left Ear: External ear normal.  Mouth/Throat: Oropharynx is clear and moist. No oropharyngeal exudate.  Eyes: Conjunctivae and EOM are normal. Pupils are equal, round, and reactive to light. Right eye exhibits no discharge. Left eye exhibits no discharge. No scleral icterus.  Neck: Normal range of motion. Neck supple. No JVD present. No tracheal deviation present. No thyromegaly present.  Cardiovascular: Normal rate, regular rhythm, normal heart sounds and intact distal pulses.  Exam reveals no gallop and no friction rub.   No murmur heard. Pulmonary/Chest: Effort normal and breath sounds normal. No respiratory distress. She has no wheezes. She has no rales. She exhibits no tenderness.    Abdominal: Soft. Bowel sounds are normal. She exhibits no distension and no mass. There is no tenderness. There is no rebound and no guarding.  Musculoskeletal: Normal range of motion. She exhibits no edema and no tenderness.  Lymphadenopathy:    She has no cervical adenopathy.  Neurological: She is alert and oriented to person, place, and time. She has normal reflexes. No cranial nerve deficit. She exhibits normal muscle tone. Coordination normal.  Skin: Skin is warm and dry. No rash noted. She is not diaphoretic. No erythema. No pallor.  Psychiatric: She has a normal mood and affect. Her behavior is normal. Judgment and thought content normal.    Filed Vitals:   09/07/14 1112  BP: 130/88  Pulse: 74  Height: 5\' 3"  (1.6 m)  Weight: 181 lb 9.6 oz (82.373 kg)  SpO2: 97%         Assessment & Plan:  #Coronary Arter Calcification - seen on CT 10.8/15  - refer cardiology for stress test  #Small Hiatal Hernia CT chest - Oct 2015  - absolutely important to follow a good diet and GERD treatment - if worried, can have pcp Johny Blamer, MD refer you to GI   #Chronic Cough  - Cough is y  from sinus drainage, and  acid reflux, All of this is working together to cause cyclical cough/LPR cough  - Asthma and ILD have have been ruled out  - Glad you are significantly better   - For   - Sinus drainage    - continue netti pot daily atleast 3 times per week   -- continue nasal steroid generic fluticasone inhaler 2 squirts each nostril daily as advised   -  Acid Reflux    -continue protonix daily - BUT TAKE ON EMPTY STOMACH     - - avoid colas, spices, cheeses, spirits, red meats, beer, chocolates, fried foods etc.,    - take LOW GLYCEMIC DIET SHEE FROM [place] - eat only foods in left lane    - sleep with head end of bed elevated    - eat small frequent meals    - do not go to bed for 3 hours after last meal  #Cyclical cough  - please choose 2-3 days and observe complete voice  rest - no  talking or whispering  - at all times there  there is urge to cough, drink water or swallow or sip on throat lozenge - take Tessalon Cough perles 200mg  three times daily as needed - if these measures do no work, will refer you to speech therapy and start you on neurontin/lyrica    #Followup - Follow with Dr Marchelle Gearingamaswamy or NP Tammy Parrett in 6 weeks - any problems call or come sooner       Dr. Kalman ShanMurali Vartan Kerins, M.D., [facility] Pulmonary and Critical Care Medicine Staff Physician [facility] [place] [facility] Pager: [phone], If no answer or between  15:00h - 7:00h: call [phone]  09/07/2014 11:44 AM

## 2014-09-09 ENCOUNTER — Ambulatory Visit (INDEPENDENT_AMBULATORY_CARE_PROVIDER_SITE_OTHER): Payer: Federal, State, Local not specified - PPO | Admitting: Cardiovascular Disease

## 2014-09-09 ENCOUNTER — Other Ambulatory Visit (HOSPITAL_COMMUNITY): Payer: Self-pay | Admitting: Cardiovascular Disease

## 2014-09-09 ENCOUNTER — Encounter: Payer: Self-pay | Admitting: Cardiovascular Disease

## 2014-09-09 ENCOUNTER — Ambulatory Visit (HOSPITAL_COMMUNITY)
Admission: RE | Admit: 2014-09-09 | Discharge: 2014-09-09 | Disposition: A | Discharge status: Home / Self Care | Payer: Federal, State, Local not specified - PPO | Source: Ambulatory Visit | Attending: Cardiovascular Disease | Admitting: Cardiovascular Disease

## 2014-09-09 VITALS — BP 116/80 | HR 66 | Resp 16 | Ht 63.0 in | Wt 180.8 lb

## 2014-09-09 DIAGNOSIS — I2584 Coronary atherosclerosis due to calcified coronary lesion: Secondary | ICD-10-CM

## 2014-09-09 DIAGNOSIS — E669 Obesity, unspecified: Secondary | ICD-10-CM | POA: Diagnosis not present

## 2014-09-09 DIAGNOSIS — I2510 Atherosclerotic heart disease of native coronary artery without angina pectoris: Secondary | ICD-10-CM | POA: Insufficient documentation

## 2014-09-09 NOTE — Assessment & Plan Note (Signed)
This was not quantified, but on review of the images there is indeed a mild amount of coronary calcium seen especially in the LAD artery distribution. She has no symptoms of coronary disease. She has a very limited risk factor profile. Her resting ECG is normal. A plain treadmill stress tests should be sufficient for evaluation at this point. If the study is normal I would not pursue any more aggressive evaluation.  On the other hand Alison Long is obese and should increase her level of physical activity and try to lose weight. She is aware of this and will try to reinvigorate her efforts at healthy lifestyle.  If her stress test is normal, I do not think she requires routine cardiology followup. I would target an LDL cholesterol less than 161100 mg/dL, primarily with lifestyle changes. I would add a statin if her LDL cholesterol is greater than 130.

## 2014-09-09 NOTE — Patient Instructions (Signed)
Your physician has requested that you have an exercise tolerance test TODAY. For further information please visit [email or web address]. Please also follow instruction sheet, as given.  Dr. Royann Shiversroitoru recommends that you schedule a follow-up appointment in: AS NEEDED.

## 2014-09-09 NOTE — Progress Notes (Signed)
Patient ID: Alison Long, female   DOB: 1956-09-27, 58 y.o.   MRN: [id number]     Reason for office visit Coronary artery calcification on CT scan  Alison Long is a 58 year old woman without known cardiac or vascular problems who was incidentally noted to have coronary calcification in the distribution of the left coronary artery and LAD artery. The study was a high-resolution CT done as part of evaluation for a persistent cough. She does not have shortness of breath or chest discomfort either at rest or with exertion. Coronary artery calcification quantitative assessment was not performed.  Alison Long has a busy work schedule and has not been able to pay a lot of attention to physical activity. Roughly 2-1/2 years ago she was active in a gym as part of recovery from knee surgery. She is able to climb 3-4 flights of steps without stopping to rest.  As far as I can tell, she has no major coronary risk factors. She has never smoked and does not have diabetes mellitus or systemic hypertension. She had lab tests done as part of a physical earlier this year and as far she knows her cholesterol numbers were "okay". There is no family history of premature CAD. Her mother is alive at age 56. Her father died at age 56 from melanoma.   Allergies  Allergen Reactions  . Sulfa Antibiotics Hives    Current Outpatient Prescriptions  Medication Sig Dispense Refill  . aspirin 81 MG tablet Take 81 mg by mouth daily.      . benzonatate (TESSALON) 200 MG capsule Take 1 capsule (200 mg total) by mouth 3 (three) times daily as needed for cough.  90 capsule  1  . benzonatate (TESSALON) 200 MG capsule Take 1 capsule (200 mg total) by mouth 3 (three) times daily as needed for cough.  30 capsule  1  . Calcium Carbonate-Vitamin D (CALCIUM 600+D HIGH POTENCY) 600-400 MG-UNIT per tablet Take 1 tablet by mouth 2 (two) times daily.       Marland Kitchen. glucosamine-chondroitin 500-400 MG tablet Take 1 tablet by mouth 3 (three) times  daily.      . pantoprazole (PROTONIX) 40 MG tablet Take 1 tablet by mouth daily.      . sertraline (ZOLOFT) 50 MG tablet Take 50 mg by mouth at bedtime.       . vitamin B-12 (CYANOCOBALAMIN) 500 MCG tablet Take 1,000 mcg by mouth daily.        No current facility-administered medications for this visit.    Past Medical History  Diagnosis Date  . PONV (postoperative nausea and vomiting)     severe nausea   . Anemia   . Chronic kidney disease     bladder has dropped   . Arthritis     arthritis in knees   . Depression   . Osteoarthritis   . Anxiety   . Overactive bladder   . Carpal tunnel syndrome   . Chronic cough     Past Surgical History  Procedure Laterality Date  . Joint replacement      right knee 4/12   . Other surgical history      bilateral arthroscopic surbery knees   . Total knee arthroplasty  11/10/2011    Procedure: TOTAL KNEE ARTHROPLASTY;  Surgeon: Erasmo Leventhalobert Andrew Collins;  Location: [facility];  Service: Orthopedics;  Laterality: Left;    Family History  Problem Relation Age of Onset  . Breast cancer Mother   . Thyroid disease Mother   .  Cancer Father     melanoma    History   Social History  . Marital Status: Married    Spouse Name: N/A    Number of Children: N/A  . Years of Education: N/A   Occupational History  . [employer]    Social History Main Topics  . Smoking status: Never Smoker   . Smokeless tobacco: Never Used  . Alcohol Use: No  . Drug Use: No  . Sexual Activity: Yes     Comment: Husband had vasectomy   Other Topics Concern  . Not on file   Social History Narrative  . No narrative on file    Review of systems: The patient specifically denies any chest pain at rest or with exertion, dyspnea at rest or with exertion, orthopnea, paroxysmal nocturnal dyspnea, syncope, palpitations, focal neurological deficits, intermittent claudication, lower extremity edema, unexplained weight gain, cough, hemoptysis or wheezing.  The  patient also denies abdominal pain, nausea, vomiting, dysphagia, diarrhea, constipation, polyuria, polydipsia, dysuria, hematuria, frequency, urgency, abnormal bleeding or bruising, fever, chills, unexpected weight changes, mood swings, change in skin or hair texture, change in voice quality, auditory or visual problems, allergic reactions or rashes, new musculoskeletal complaints other than usual "aches and pains".   PHYSICAL EXAM BP 116/80  Pulse 66  Resp 16  Ht 5\' 3"  (1.6 m)  Wt 82.01 kg (180 lb 12.8 oz)  BMI 32.04 kg/m2  LMP 56/20/2011  General: Alert, oriented x3, no distress Head: no evidence of trauma, PERRL, EOMI, no exophtalmos or lid lag, no myxedema, no xanthelasma; normal ears, nose and oropharynx Neck: normal jugular venous pulsations and no hepatojugular reflux; brisk carotid pulses without delay and no carotid bruits Chest: clear to auscultation, no signs of consolidation by percussion or palpation, normal fremitus, symmetrical and full respiratory excursions Cardiovascular: normal position and quality of the apical impulse, regular rhythm, normal first and second heart sounds, no murmurs, rubs or gallops Abdomen: no tenderness or distention, no masses by palpation, no abnormal pulsatility or arterial bruits, normal bowel sounds, no hepatosplenomegaly Extremities: no clubbing, cyanosis or edema; 2+ radial, ulnar and brachial pulses bilaterally; 2+ right femoral, posterior tibial and dorsalis pedis pulses; 2+ left femoral, posterior tibial and dorsalis pedis pulses; no subclavian or femoral bruits Neurological: grossly nonfocal   EKG: Normal sinus rhythm, normal tracing  Lipid Panel  No results found for this basename: chol, trig, hdl, cholhdl, vldl, ldlcalc, ldldirect    BMET    Component Value Date/Time   NA 136 11/12/2011 0530   K 3.9 11/12/2011 0530   CL 99 11/12/2011 0530   CO2 30 11/12/2011 0530   GLUCOSE 119* 11/12/2011 0530   BUN 7 11/12/2011 0530    CREATININE 0.67 11/12/2011 0530   CALCIUM 9.4 11/12/2011 0530   GFRNONAA >90 11/12/2011 0530   GFRAA >90 11/12/2011 0530     ASSESSMENT AND PLAN Coronary artery calcification seen on CAT scan This was not quantified, but on review of the images there is indeed a mild amount of coronary calcium seen especially in the LAD artery distribution. She has no symptoms of coronary disease. She has a very limited risk factor profile. Her resting ECG is normal. A plain treadmill stress tests should be sufficient for evaluation at this point. If the study is normal I would not pursue any more aggressive evaluation.  On the other hand Alison Long is obese and should increase her level of physical activity and try to lose weight. She is aware  of this and will try to reinvigorate her efforts at healthy lifestyle.  If her stress test is normal, I do not think she requires routine cardiology followup. I would target an LDL cholesterol less than 295 mg/dL, primarily with lifestyle changes. I would add a statin if her LDL cholesterol is greater than 130.    Junious Silk, MD, [facility] [phone] office [phone] pager

## 2014-09-09 NOTE — Procedures (Signed)
Exercise Treadmill Test  Pre-Exercise Testing Evaluation  NSR  Test  Exercise Tolerance Test Ordering MD: M. Ihsan Nomura, MD  Interpreting MD:   Unique Test No: 1 Treadmill:  1  Indication for ETT: Artery Calcification per CAT Scan  Contraindication to ETT: No   Stress Modality: exercise - treadmill  Cardiac Imaging Performed: non   Protocol: standard Bruce - maximal  Max BP:  168/90  Max MPHR (bpm): 164 85% MPR (bpm): 139  MPHR obtained (bpm):  162 % MPHR obtained: 98  Reached 85% MPHR (min:sec):  6:40 Total Exercise Time (min-sec):  9:00  Workload in METS:  10.40 Borg Scale:   Reason ETT Terminated:  fatigue    ST Segment Analysis At Rest: normal ST segments - no evidence of significant ST depression With Exercise: no evidence of significant ST depression  Other Information Arrhythmia:  No Angina during ETT:  absent (0) Quality of ETT:  diagnostic  ETT Interpretation:  normal - no evidence of ischemia by ST analysis  Comments: Good exercise tolerance. Hypertensive response to exercise  Recommendations: Weight loss, sodium restriction, regular moderate exercise.  Thurmon FairMihai Reace Breshears, MD, [facility] [phone] office [phone] pager

## 2014-09-25 ENCOUNTER — Telehealth: Payer: Self-pay | Admitting: Internal Medicine

## 2014-09-25 MED ORDER — FLUTICASONE PROPIONATE 50 MCG/ACT NA SUSP
2.0000 | Freq: Every day | NASAL | Status: DC
Start: 2014-09-25 — End: 2015-01-20

## 2014-09-25 NOTE — Telephone Encounter (Signed)
Called pt and aware RX sent in. Nothing further needed 

## 2014-09-28 ENCOUNTER — Encounter: Payer: Self-pay | Admitting: Cardiovascular Disease

## 2014-10-20 ENCOUNTER — Ambulatory Visit (INDEPENDENT_AMBULATORY_CARE_PROVIDER_SITE_OTHER): Payer: Federal, State, Local not specified - PPO | Admitting: Adult Health

## 2014-10-20 ENCOUNTER — Encounter: Payer: Self-pay | Admitting: Adult Health

## 2014-10-20 VITALS — BP 122/82 | HR 66 | Temp 97.6°F | Ht 63.0 in | Wt 177.8 lb

## 2014-10-20 DIAGNOSIS — R05 Cough: Secondary | ICD-10-CM

## 2014-10-20 DIAGNOSIS — R053 Chronic cough: Secondary | ICD-10-CM

## 2014-10-20 NOTE — Patient Instructions (Signed)
Add Pepcid 20mg  at bedtime Add Chlorpheniramine 4mg  2 at bedtime as needed for drainage Add Allegra 180mg  in the morning as needed for drainage Use Delsym and Tessalon to help prevent coughing No mints, no gums Sips of water to help prevent throat clearing and soothe the throat Use Sugarless candy instead (Jolly Ranchers, Life Savers) Please contact office for sooner follow up if symptoms do not improve or worsen or seek emergency care  Follow up in 2 months with Dr Marchelle Gearingamaswamy

## 2014-11-02 NOTE — Assessment & Plan Note (Signed)
Cyclical cough with AR/GERD triggers   Plan  Add Pepcid 20mg  at bedtime Add Chlorpheniramine 4mg  2 at bedtime as needed for drainage Add Allegra 180mg  in the morning as needed for drainage Use Delsym and Tessalon to help prevent coughing No mints, no gums Sips of water to help prevent throat clearing and soothe the throat Use Sugarless candy instead (Jolly Ranchers, Life Savers) Please contact office for sooner follow up if symptoms do not improve or worsen or seek emergency care  Follow up in 2 months with Dr Marchelle Gearingamaswamy

## 2014-11-02 NOTE — Progress Notes (Signed)
Subjective:    Patient ID: Alison Long, female    DOB: 1956/11/26, 58 y.o.   MRN: [id number]  HPI  PCP - Holley Bouche[facility]all [facility]  HPI  IOV 08/28/2014  Chief Complaint  Patient presents with  . Pulmonary Consult    Pt referred by Dr. Tiburcio PeaHarris for chronic cough. Pt states c/o prod cough with clear mucus x 1 year.     58 year old female. Approix 1 year ago developed cough. Now chronic. Does not remember details but suspects might have followed a sinus infection but she is not sure. Cough is moderate to severe in intensity. SOme days good and some days bad but is there every day. Thinks might be progressive past few months. Associted with sputum but has to cough it out deep and hard, and tickle and tightness in chest and hoarse voice. No clear cut precipitating factor - happens sponatneosuly but consistently triggered by heavy meals and also periodically when she lies down. ALbuterol mdi seems to help partially. REcent 3 month trial of ppi has helped some but not fully effective (noted to be on fish oil for years), Denies wheeze or dyspnea or syncope. RSI cough score is 24 and c/w LPR cough   Cough relevant hx   Sius and Allergy: she has mild spring allergy which she treats with OTC anti-histamine.  Denies daily post nasal drip. Denies alergy or ENT eval   ACE inhibitor intake: denies   Airway hx/Pulmonar hx: no dx of asthma or allergies or ILD. CT chest 2012 showed post op LLL pneumonia but a cxr spring 2015 was clear. Alb mdi helps cough somewhat   GERD: She is on scheduled PPI and TUMS for GERD. I do notce [facility] as well   EXposure hx: she is a non smoker   Others: Works in [employer] Spends all day talking on phone but does not think this a voice stress    Tests  - CXR march 2015 is clear - CT chest 2012 - had fever aftrer knee surgery - showed LLL pneumonia  REC Cough is possibly  from sinus drainage, acid reflux, and possible asthma All of this is working  together to cause cyclical cough/LPR cough  #Sinus drainage  - start/continue netti pot daily atleast 3 times per week -- start nasal steroid generic fluticasone inhaler 2 squirts each nostril daily as advised (nurse will do script)   #Possible Acid Reflux  -continue protonix daily - BUT TAKE ON EMPTY STOMACH   - - avoid colas, spices, cheeses, spirits, red meats, beer, chocolates, fried foods etc.,  - take LOW GLYCEMIC DIET SHEE FROM US - eat only foods in left lane  - sleep with head end of bed elevated  - eat small frequent meals  - do not go to bed for 3 hours after last meal  #Possible Asthma  - do methacholine challenge test (you cannot be pregnant or have heart disease for this test) - till results back use albuterol as needed   #Cyclical cough  - please choose 2-3 days and observe complete voice rest - no talking or whispering  - at all times there  there is urge to cough, drink water or swallow or sip on throat lozenge - take Tessalon Cough perles 200mg  three times daily as needed  #Others  - High Resolution CT chest without contrast on ILD protocol for chronic cough   #Followup - Follow with Dr Marchelle Gearingamaswamy or NP Babette Relicammy  Madia Carvell in 7-14 days - any problems call or come sooner   OV 09/07/2014  Chief Complaint  Patient presents with  . Follow-up    Pt here after methacholine challenge and HRCT. Pt states her cough has improved since last OV. Pt states she has strange pain under right breast, tight pain. Pt denies SOB and CP/tightness.      FU chronic cough. With sinus and GERD rx she is 25% better. RSI cough score down to 16. She has lost 5# on low glycemic diet that is helping her cough and gerd. CT chest is free of ILD or nodules but shows small hiatal hernia (her mom is s/p nissen). Methacholine challenge test 09/03/14 is negative for asthma. She has been unable to do voice rest.  Of note, she has had some non specific RUQ pain intermittent and mild and dull since  starting low glycemic diet; referred to Elizabeth Sauer, MD   Past, Family, Social reviewed:   - she has co art calcification on CT oct 2015 - no smoking hx. No chest pain. No fmaily hx of cad - going to be grand mom soon     Dr Gretta Cool Reflux Symptom Index (> 13-15 suggestive of LPR cough)  08/28/2014  09/07/2014   Hoarseness of problem with voice 3 2  Clearing  Of Throat 3 2  Excess throat mucus or feeling of post nasal drip 4 3  Difficulty swallowing food, liquid or tablets 0 0  Cough after eating or lying down 5 3  Breathing difficulties or choking episodes 0 0  Troublesome or annoying cough 5 3  Sensation of something sticking in throat or lump in throat 2 0  Heartburn, chest pain, indigestion, or stomach acid coming up 2 3  TOTAL 24 16  Interventions done at end of visit Sinus Rx, GERD rx,low glycemic diet,  methacholine test and CT chest Continue sinus Rx, gerd rx, low glycemic diet and emphasized voice rest    10/20/14 Follow up (Chronic cough )  Returns for 6 week follow up for cough  Being treated for cyclical cough with rhinitis and GERD.  Says she is better but still having coughing spells, but not as often; change of weather causing nasal congestion.  Does get drippy drainage in throat esp at night.  More cough at night.  Patient denies any chest pain, orthopnea, PND or leg swelling, fever. Previous CT chest was neg for ILD    Review of Systems  Constitutional:   No  weight loss, night sweats,  Fevers, chills, fatigue, or  lassitude.  HEENT:   No headaches,  Difficulty swallowing,  Tooth/dental problems, or  Sore throat,                No sneezing, itching, ear ache, + nasal congestion, post nasal drip,   CV:  No chest pain,  Orthopnea, PND, swelling in lower extremities, anasarca, dizziness, palpitations, syncope.   GI  No heartburn, indigestion, abdominal pain, nausea, vomiting, diarrhea, change in bowel habits, loss of appetite, bloody stools.    Resp:    No chest wall deformity  Skin: no rash or lesions.  GU: no dysuria, change in color of urine, no urgency or frequency.  No flank pain, no hematuria   MS:  No joint pain or swelling.  No decreased range of motion.  No back pain.  Psych:  No change in mood or affect. No depression or anxiety.  No memory loss.  Objective:   Physical Exam  GEN: A/Ox3; pleasant , NAD, well nourished   HEENT:  [place]/AT,  EACs-clear, TMs-wnl, NOSE-clear, THROAT-clear, no lesions, no postnasal drip or exudate noted.   NECK:  Supple w/ fair ROM; no JVD; normal carotid impulses w/o bruits; no thyromegaly or nodules palpated; no lymphadenopathy.  RESP  Clear  P & A; w/o, wheezes/ rales/ or rhonchi.no accessory muscle use, no dullness to percussion  CARD:  RRR, no m/r/g  , no peripheral edema, pulses intact, no cyanosis or clubbing.  GI:   Soft & nt; nml bowel sounds; no organomegaly or masses detected.  Musco: Warm bil, no deformities or joint swelling noted.   Neuro: alert, no focal deficits noted.    Skin: Warm, no lesions or rashes      Assessment & Plan:

## 2014-12-25 ENCOUNTER — Other Ambulatory Visit: Payer: Self-pay | Admitting: Internal Medicine

## 2015-01-20 ENCOUNTER — Other Ambulatory Visit: Payer: Self-pay | Admitting: Internal Medicine

## 2015-02-28 ENCOUNTER — Other Ambulatory Visit: Payer: Self-pay | Admitting: Internal Medicine

## 2015-03-03 NOTE — Telephone Encounter (Signed)
Flonase sent no refills Per TP 10/21/14 patient needs 2 mos f/u.

## 2015-04-13 ENCOUNTER — Other Ambulatory Visit: Payer: Self-pay | Admitting: Internal Medicine

## 2015-04-21 ENCOUNTER — Other Ambulatory Visit: Payer: Self-pay | Admitting: Internal Medicine

## 2015-07-30 ENCOUNTER — Other Ambulatory Visit: Payer: Self-pay

## 2015-07-30 DIAGNOSIS — Z1231 Encounter for screening mammogram for malignant neoplasm of breast: Secondary | ICD-10-CM

## 2015-09-06 ENCOUNTER — Ambulatory Visit: Payer: Federal, State, Local not specified - PPO

## 2015-09-28 ENCOUNTER — Ambulatory Visit
Admission: RE | Admit: 2015-09-28 | Discharge: 2015-09-28 | Disposition: A | Discharge status: Home / Self Care | Payer: Federal, State, Local not specified - PPO | Source: Ambulatory Visit

## 2015-09-28 DIAGNOSIS — Z1231 Encounter for screening mammogram for malignant neoplasm of breast: Secondary | ICD-10-CM

## 2016-07-03 DIAGNOSIS — K080 Exfoliation of teeth due to systemic causes: Secondary | ICD-10-CM | POA: Diagnosis not present

## 2016-10-05 DIAGNOSIS — Z1231 Encounter for screening mammogram for malignant neoplasm of breast: Secondary | ICD-10-CM | POA: Diagnosis not present

## 2017-01-18 DIAGNOSIS — K080 Exfoliation of teeth due to systemic causes: Secondary | ICD-10-CM | POA: Diagnosis not present

## 2017-01-25 DIAGNOSIS — Z96653 Presence of artificial knee joint, bilateral: Secondary | ICD-10-CM | POA: Diagnosis not present

## 2017-02-19 DIAGNOSIS — N8189 Other female genital prolapse: Secondary | ICD-10-CM | POA: Diagnosis not present

## 2017-02-19 DIAGNOSIS — Z124 Encounter for screening for malignant neoplasm of cervix: Secondary | ICD-10-CM | POA: Diagnosis not present

## 2017-02-19 DIAGNOSIS — N814 Uterovaginal prolapse, unspecified: Secondary | ICD-10-CM | POA: Diagnosis not present

## 2017-02-19 DIAGNOSIS — Z01419 Encounter for gynecological examination (general) (routine) without abnormal findings: Secondary | ICD-10-CM | POA: Diagnosis not present

## 2017-04-19 ENCOUNTER — Ambulatory Visit
Admission: RE | Admit: 2017-04-19 | Discharge: 2017-04-19 | Disposition: A | Discharge status: Home / Self Care | Payer: Federal, State, Local not specified - PPO | Source: Ambulatory Visit | Attending: Family Medicine | Admitting: Family Medicine

## 2017-04-19 ENCOUNTER — Other Ambulatory Visit: Payer: Self-pay | Admitting: Family Medicine

## 2017-04-19 DIAGNOSIS — K449 Diaphragmatic hernia without obstruction or gangrene: Secondary | ICD-10-CM | POA: Diagnosis not present

## 2017-04-19 DIAGNOSIS — J9811 Atelectasis: Secondary | ICD-10-CM | POA: Diagnosis not present

## 2017-04-19 DIAGNOSIS — R5383 Other fatigue: Secondary | ICD-10-CM | POA: Diagnosis not present

## 2017-04-19 DIAGNOSIS — Z6833 Body mass index (BMI) 33.0-33.9, adult: Secondary | ICD-10-CM | POA: Diagnosis not present

## 2017-04-19 DIAGNOSIS — E6609 Other obesity due to excess calories: Secondary | ICD-10-CM | POA: Diagnosis not present

## 2017-06-07 DIAGNOSIS — D225 Melanocytic nevi of trunk: Secondary | ICD-10-CM | POA: Diagnosis not present

## 2017-06-07 DIAGNOSIS — L814 Other melanin hyperpigmentation: Secondary | ICD-10-CM | POA: Diagnosis not present

## 2017-06-07 DIAGNOSIS — D2371 Other benign neoplasm of skin of right lower limb, including hip: Secondary | ICD-10-CM | POA: Diagnosis not present

## 2017-06-07 DIAGNOSIS — L821 Other seborrheic keratosis: Secondary | ICD-10-CM | POA: Diagnosis not present

## 2017-07-24 DIAGNOSIS — K080 Exfoliation of teeth due to systemic causes: Secondary | ICD-10-CM | POA: Diagnosis not present

## 2017-07-25 DIAGNOSIS — R05 Cough: Secondary | ICD-10-CM | POA: Diagnosis not present

## 2017-07-25 DIAGNOSIS — Z23 Encounter for immunization: Secondary | ICD-10-CM | POA: Diagnosis not present

## 2017-07-25 DIAGNOSIS — M545 Low back pain: Secondary | ICD-10-CM | POA: Diagnosis not present

## 2017-07-25 DIAGNOSIS — K219 Gastro-esophageal reflux disease without esophagitis: Secondary | ICD-10-CM | POA: Diagnosis not present

## 2017-10-08 DIAGNOSIS — N814 Uterovaginal prolapse, unspecified: Secondary | ICD-10-CM | POA: Diagnosis not present

## 2017-10-08 DIAGNOSIS — R3915 Urgency of urination: Secondary | ICD-10-CM | POA: Diagnosis not present

## 2017-10-08 DIAGNOSIS — R351 Nocturia: Secondary | ICD-10-CM | POA: Diagnosis not present

## 2017-10-08 DIAGNOSIS — R32 Unspecified urinary incontinence: Secondary | ICD-10-CM | POA: Diagnosis not present

## 2017-10-08 DIAGNOSIS — Z1231 Encounter for screening mammogram for malignant neoplasm of breast: Secondary | ICD-10-CM | POA: Diagnosis not present

## 2017-10-11 MED FILL — CIPROFLOXACIN HCL 250 MG TA: 250 | 7 days supply | Qty: 14 | Fill #0

## 2017-11-15 MED FILL — SERTRALINE HCL 50 MG TABLET: 50 | 90 days supply | Qty: 90 | Fill #0

## 2018-01-06 DIAGNOSIS — L309 Dermatitis, unspecified: Secondary | ICD-10-CM | POA: Diagnosis not present

## 2018-01-14 MED FILL — predniSONE 10 MG TABS: 10 | 8 days supply | Qty: 20 | Fill #0

## 2018-01-24 DIAGNOSIS — Z13220 Encounter for screening for lipoid disorders: Secondary | ICD-10-CM | POA: Diagnosis not present

## 2018-01-24 DIAGNOSIS — L309 Dermatitis, unspecified: Secondary | ICD-10-CM | POA: Diagnosis not present

## 2018-01-24 DIAGNOSIS — Z0000 Encounter for general adult medical examination without abnormal findings: Secondary | ICD-10-CM | POA: Diagnosis not present

## 2018-01-24 DIAGNOSIS — Z23 Encounter for immunization: Secondary | ICD-10-CM | POA: Diagnosis not present

## 2018-01-24 MED FILL — PANTOPRAZOLE SOD DR 40 MG T: 40 | 30 days supply | Qty: 30 | Fill #0

## 2018-01-24 MED FILL — TRIAMCINOLONE ACETONIDE 0.1: 0.1 | 30 days supply | Qty: 454 | Fill #0

## 2018-01-28 DIAGNOSIS — K080 Exfoliation of teeth due to systemic causes: Secondary | ICD-10-CM | POA: Diagnosis not present

## 2018-02-01 DIAGNOSIS — L2089 Other atopic dermatitis: Secondary | ICD-10-CM | POA: Diagnosis not present

## 2018-02-01 DIAGNOSIS — L404 Guttate psoriasis: Secondary | ICD-10-CM | POA: Diagnosis not present

## 2018-02-01 DIAGNOSIS — L821 Other seborrheic keratosis: Secondary | ICD-10-CM | POA: Diagnosis not present

## 2018-02-01 DIAGNOSIS — L308 Other specified dermatitis: Secondary | ICD-10-CM | POA: Diagnosis not present

## 2018-02-01 MED FILL — hydrOXYzine HCL 25 MG TABS: 25 | 30 days supply | Qty: 30 | Fill #0

## 2018-02-21 DIAGNOSIS — N819 Female genital prolapse, unspecified: Secondary | ICD-10-CM | POA: Diagnosis not present

## 2018-02-21 DIAGNOSIS — Z01411 Encounter for gynecological examination (general) (routine) with abnormal findings: Secondary | ICD-10-CM | POA: Diagnosis not present

## 2018-02-21 DIAGNOSIS — R3915 Urgency of urination: Secondary | ICD-10-CM | POA: Diagnosis not present

## 2018-02-21 DIAGNOSIS — Z6829 Body mass index (BMI) 29.0-29.9, adult: Secondary | ICD-10-CM | POA: Diagnosis not present

## 2018-02-21 MED FILL — SERTRALINE HCL 50 MG TABLET: 50 | 90 days supply | Qty: 90 | Fill #0

## 2018-05-08 DIAGNOSIS — N8110 Cystocele, unspecified: Secondary | ICD-10-CM | POA: Diagnosis not present

## 2018-05-08 DIAGNOSIS — N3946 Mixed incontinence: Secondary | ICD-10-CM | POA: Diagnosis not present

## 2018-05-08 DIAGNOSIS — R351 Nocturia: Secondary | ICD-10-CM | POA: Diagnosis not present

## 2018-05-08 DIAGNOSIS — N812 Incomplete uterovaginal prolapse: Secondary | ICD-10-CM | POA: Diagnosis not present

## 2018-05-21 MED FILL — SERTRALINE HCL 50 MG TABLET: 50 | 90 days supply | Qty: 90 | Fill #0

## 2018-07-01 MED FILL — PANTOPRAZOLE SOD DR 40 MG T: 40 | 30 days supply | Qty: 30 | Fill #1

## 2018-07-19 MED FILL — AMOXICILLIN 500 MG CAPSULE: 500 | 1 days supply | Qty: 4 | Fill #0

## 2018-07-30 DIAGNOSIS — M170 Bilateral primary osteoarthritis of knee: Secondary | ICD-10-CM | POA: Diagnosis not present

## 2018-08-05 DIAGNOSIS — K080 Exfoliation of teeth due to systemic causes: Secondary | ICD-10-CM | POA: Diagnosis not present

## 2018-08-05 MED FILL — PANTOPRAZOLE SOD DR 40 MG T: 40 | 30 days supply | Qty: 30 | Fill #2

## 2018-08-05 MED FILL — AMOXICILLIN 500 MG CAPSULE: 500 | 1 days supply | Qty: 4 | Fill #1

## 2018-08-21 MED FILL — SERTRALINE HCL 50 MG TABLET: 50 | 90 days supply | Qty: 90 | Fill #1

## 2018-08-30 MED FILL — FLUTICASONE PROP 50 MCG SPR: 50 | 60 days supply | Qty: 16 | Fill #0

## 2018-09-30 MED FILL — PANTOPRAZOLE SOD DR 40 MG T: 40 | 30 days supply | Qty: 30 | Fill #3

## 2018-10-14 DIAGNOSIS — L905 Scar conditions and fibrosis of skin: Secondary | ICD-10-CM | POA: Diagnosis not present

## 2018-10-14 DIAGNOSIS — D225 Melanocytic nevi of trunk: Secondary | ICD-10-CM | POA: Diagnosis not present

## 2018-10-14 DIAGNOSIS — L821 Other seborrheic keratosis: Secondary | ICD-10-CM | POA: Diagnosis not present

## 2018-10-14 DIAGNOSIS — D485 Neoplasm of uncertain behavior of skin: Secondary | ICD-10-CM | POA: Diagnosis not present

## 2018-10-14 DIAGNOSIS — D2261 Melanocytic nevi of right upper limb, including shoulder: Secondary | ICD-10-CM | POA: Diagnosis not present

## 2018-10-14 DIAGNOSIS — D2262 Melanocytic nevi of left upper limb, including shoulder: Secondary | ICD-10-CM | POA: Diagnosis not present

## 2018-10-23 DIAGNOSIS — N812 Incomplete uterovaginal prolapse: Secondary | ICD-10-CM | POA: Diagnosis not present

## 2018-10-23 DIAGNOSIS — N3946 Mixed incontinence: Secondary | ICD-10-CM | POA: Diagnosis not present

## 2018-10-23 DIAGNOSIS — N8110 Cystocele, unspecified: Secondary | ICD-10-CM | POA: Diagnosis not present

## 2018-11-07 DIAGNOSIS — N812 Incomplete uterovaginal prolapse: Secondary | ICD-10-CM | POA: Diagnosis not present

## 2018-11-07 DIAGNOSIS — N3946 Mixed incontinence: Secondary | ICD-10-CM | POA: Diagnosis not present

## 2018-11-07 DIAGNOSIS — N8500 Endometrial hyperplasia, unspecified: Secondary | ICD-10-CM | POA: Diagnosis not present

## 2018-11-07 DIAGNOSIS — N841 Polyp of cervix uteri: Secondary | ICD-10-CM | POA: Diagnosis not present

## 2018-11-08 DIAGNOSIS — N812 Incomplete uterovaginal prolapse: Secondary | ICD-10-CM | POA: Diagnosis not present

## 2018-11-08 DIAGNOSIS — N3946 Mixed incontinence: Secondary | ICD-10-CM | POA: Diagnosis not present

## 2018-11-08 MED FILL — IBUPROFEN 800 MG TAB: 800 | 10 days supply | Qty: 30 | Fill #0

## 2018-11-08 MED FILL — oxyCODONE HCL 5 MG TABS: 5 | 2 days supply | Qty: 10 | Fill #0

## 2018-11-22 DIAGNOSIS — N3946 Mixed incontinence: Secondary | ICD-10-CM | POA: Diagnosis not present

## 2018-11-22 DIAGNOSIS — Z4889 Encounter for other specified surgical aftercare: Secondary | ICD-10-CM | POA: Diagnosis not present

## 2018-11-25 MED FILL — SERTRALINE HCL 50 MG TABLET: 50 | 90 days supply | Qty: 90 | Fill #2

## 2018-12-04 MED FILL — METHOCARBAMOL 500 MG TABLET: 500 | 15 days supply | Qty: 30 | Fill #0

## 2018-12-18 DIAGNOSIS — Z87448 Personal history of other diseases of urinary system: Secondary | ICD-10-CM | POA: Diagnosis not present

## 2019-02-10 DIAGNOSIS — K080 Exfoliation of teeth due to systemic causes: Secondary | ICD-10-CM | POA: Diagnosis not present

## 2019-02-10 MED FILL — AMOXICILLIN 500 MG CAPSULE: 500 | 1 days supply | Qty: 4 | Fill #2

## 2019-02-17 MED FILL — SERTRALINE HCL 50 MG TABLET: 50 | 90 days supply | Qty: 90 | Fill #3

## 2019-02-18 MED FILL — PANTOPRAZOLE SOD DR 40 MG T: 40 | 30 days supply | Qty: 30 | Fill #0

## 2019-03-05 IMAGING — CR DG CHEST 2V
2 series · 2 of 2 positions shown · non-contrast
Comparison: CT chest 09/03/2014.  Chest x-ray 02/16/2014.

CLINICAL DATA: Hiatal hernia.

EXAM:
CHEST  2 VIEW

[w chest pa]
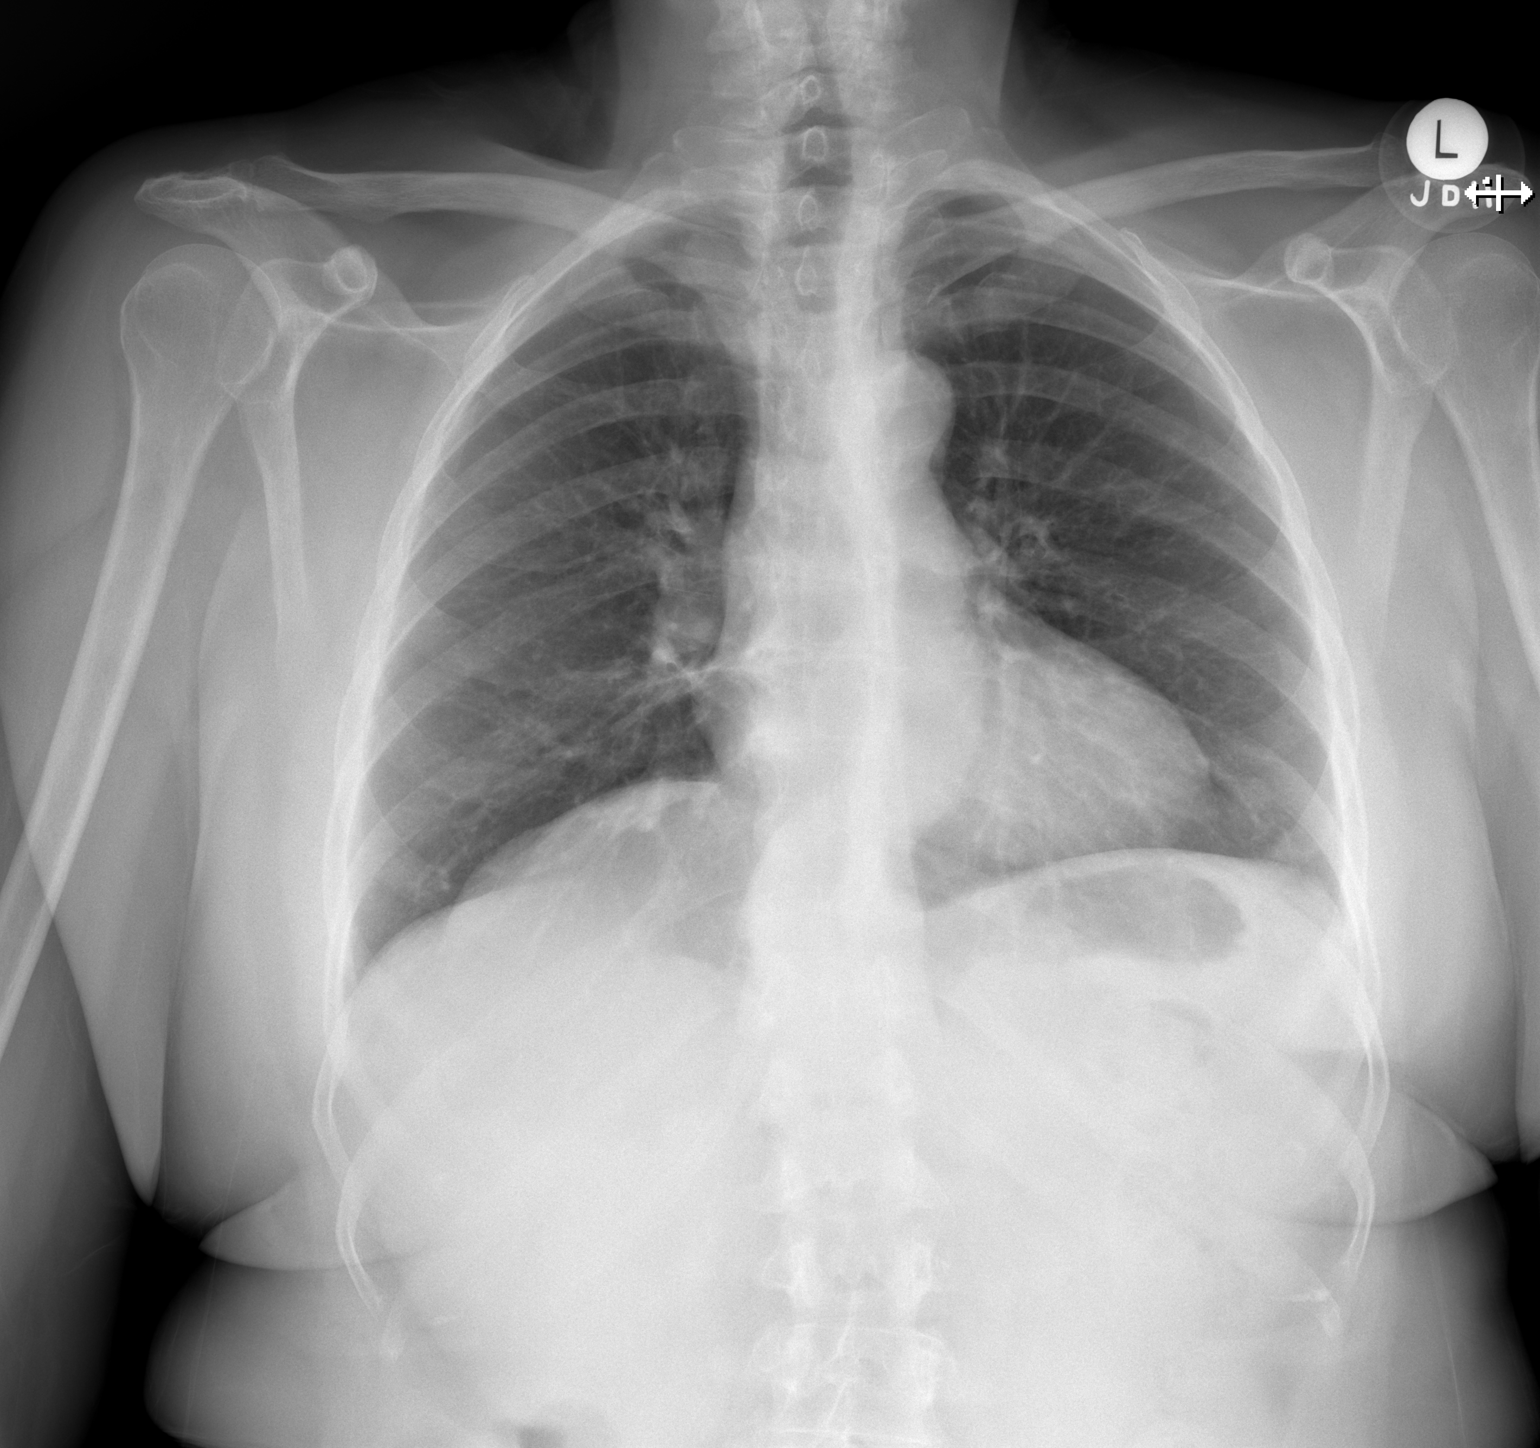

[w chest lat]
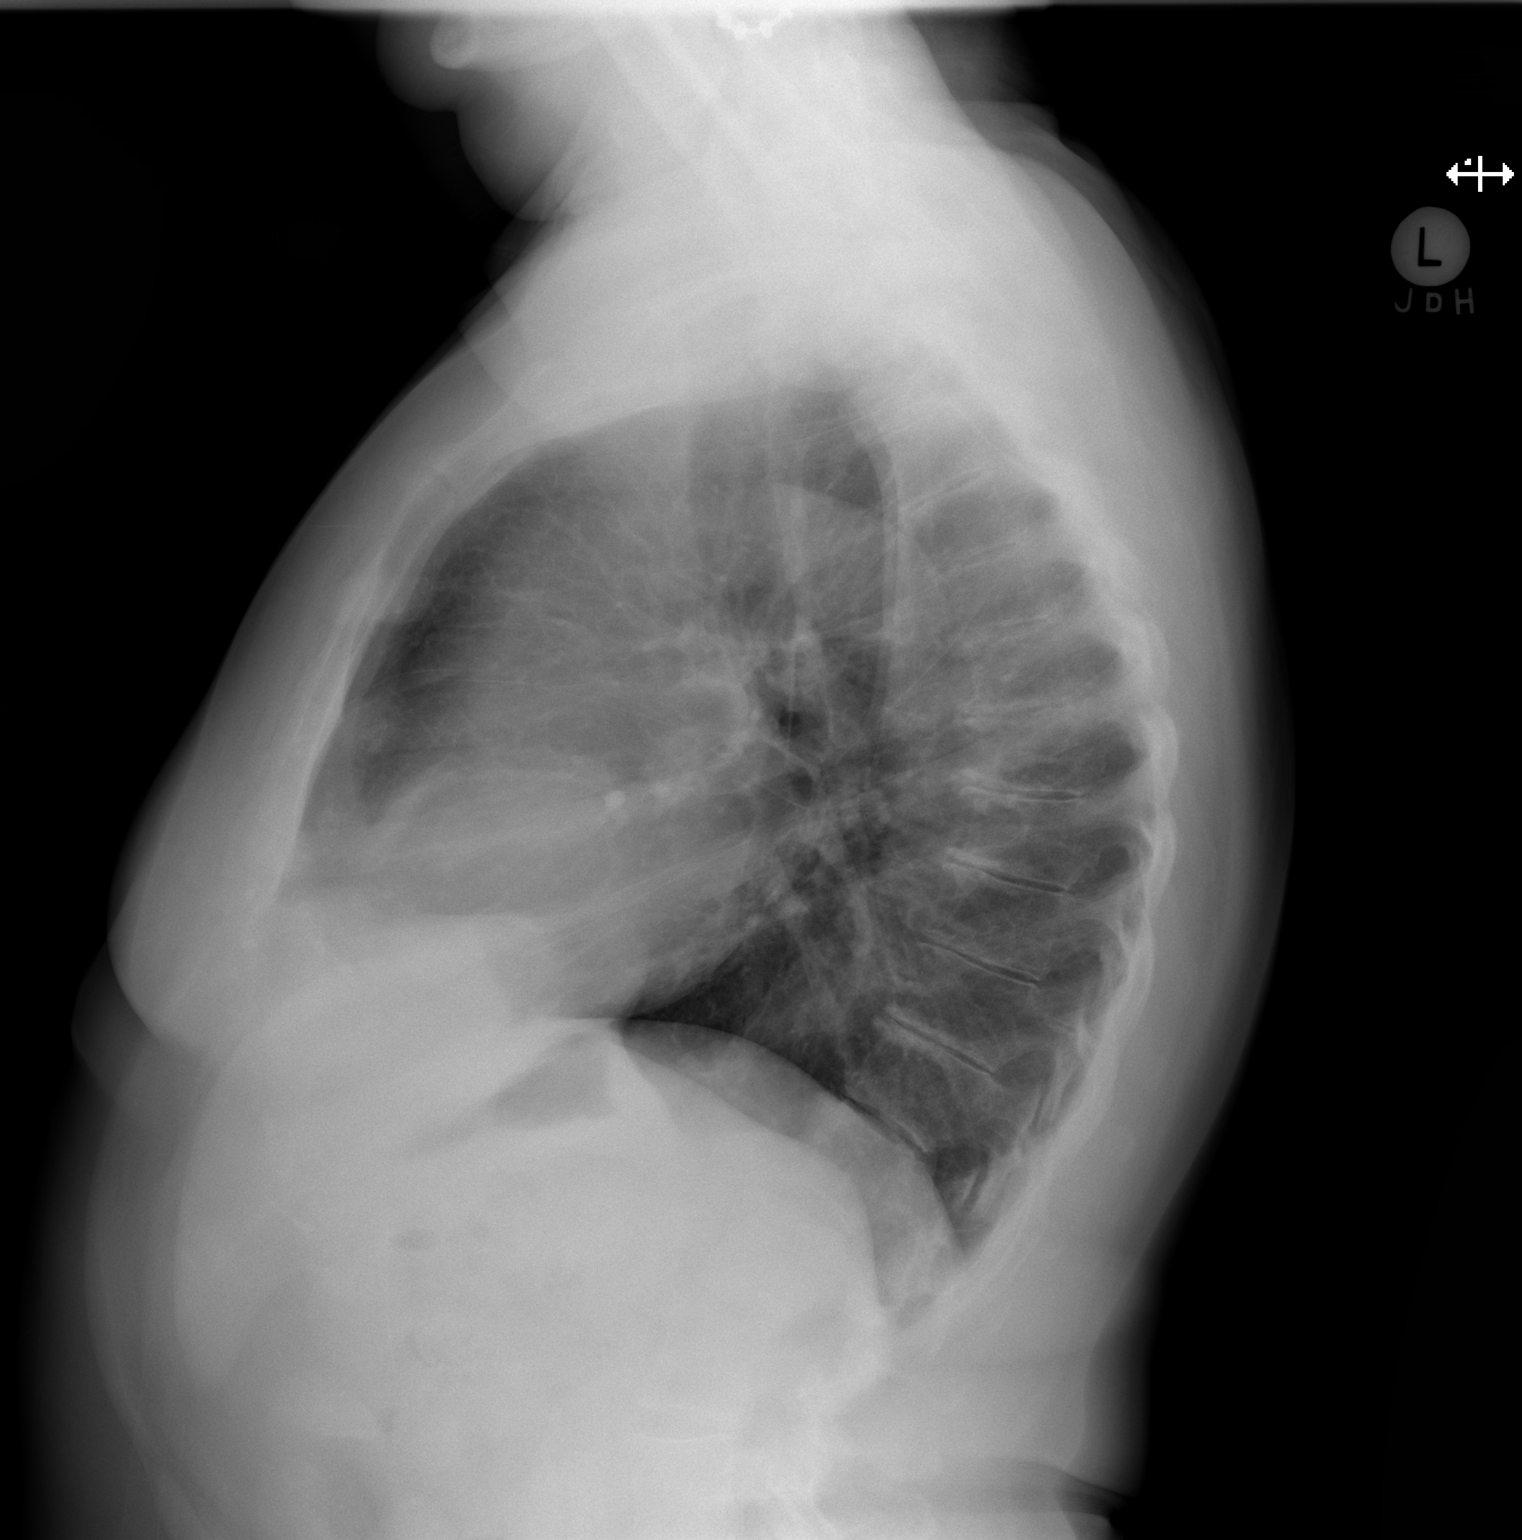

[2 of 2 positions shown; findings below may reference images not displayed]

FINDINGS: Mediastinum hilar structures normal. Low lung volumes with mild left
base subsegmental atelectasis. No pleural effusion or pneumothorax.
No acute bony abnormality. Degenerative change thoracic spine .
IMPRESSION: Low lung volumes with mild left base subsegmental atelectasis.
Follow-up chest x-ray to evaluate for clearing suggested. Exam
otherwise unremarkable.

## 2019-05-07 DIAGNOSIS — R1013 Epigastric pain: Secondary | ICD-10-CM | POA: Diagnosis not present

## 2019-05-07 DIAGNOSIS — J301 Allergic rhinitis due to pollen: Secondary | ICD-10-CM | POA: Diagnosis not present

## 2019-05-07 DIAGNOSIS — F419 Anxiety disorder, unspecified: Secondary | ICD-10-CM | POA: Diagnosis not present

## 2019-05-07 MED FILL — FLUTICASONE PROP 50 MCG SPR: 50 | 60 days supply | Qty: 16 | Fill #0

## 2019-05-07 MED FILL — SERTRALINE HCL 50 MG TABS: 50 | 90 days supply | Qty: 90 | Fill #0

## 2019-05-07 MED FILL — SUCRALFATE 1 GM TABLET: 1 | 15 days supply | Qty: 60 | Fill #0

## 2019-05-12 MED FILL — PANTOPRAZOLE SOD DR 40 MG T: 40 | 90 days supply | Qty: 90 | Fill #0

## 2019-08-21 MED FILL — SERTRALINE HCL 50 MG TABS: 50 | 90 days supply | Qty: 90 | Fill #1

## 2019-09-03 MED FILL — AMOXICILLIN 500 MG CAPSULE: 500 | 1 days supply | Qty: 4 | Fill #0

## 2019-09-08 DIAGNOSIS — R05 Cough: Secondary | ICD-10-CM | POA: Diagnosis not present

## 2019-09-08 MED FILL — BENZONATATE 200 MG CAP: 200 | 7 days supply | Qty: 21 | Fill #0

## 2019-09-08 MED FILL — AZITHROMYCIN 250 MG TABLET: 250 | 5 days supply | Qty: 6 | Fill #0

## 2019-09-09 DIAGNOSIS — R05 Cough: Secondary | ICD-10-CM | POA: Diagnosis not present

## 2019-10-17 DIAGNOSIS — D2271 Melanocytic nevi of right lower limb, including hip: Secondary | ICD-10-CM | POA: Diagnosis not present

## 2019-10-17 DIAGNOSIS — L570 Actinic keratosis: Secondary | ICD-10-CM | POA: Diagnosis not present

## 2019-10-17 DIAGNOSIS — D485 Neoplasm of uncertain behavior of skin: Secondary | ICD-10-CM | POA: Diagnosis not present

## 2019-10-17 DIAGNOSIS — D1801 Hemangioma of skin and subcutaneous tissue: Secondary | ICD-10-CM | POA: Diagnosis not present

## 2019-10-17 DIAGNOSIS — L821 Other seborrheic keratosis: Secondary | ICD-10-CM | POA: Diagnosis not present

## 2019-10-17 DIAGNOSIS — L814 Other melanin hyperpigmentation: Secondary | ICD-10-CM | POA: Diagnosis not present

## 2019-11-18 MED FILL — SERTRALINE HCL 50 MG TABLET: 50 | 90 days supply | Qty: 90 | Fill #2

## 2020-02-02 MED FILL — SERTRALINE HCL 50 MG TABLET: 50 | 90 days supply | Qty: 90 | Fill #3

## 2020-02-25 DIAGNOSIS — L309 Dermatitis, unspecified: Secondary | ICD-10-CM | POA: Diagnosis not present

## 2020-02-25 DIAGNOSIS — M25552 Pain in left hip: Secondary | ICD-10-CM | POA: Diagnosis not present

## 2020-02-25 DIAGNOSIS — L720 Epidermal cyst: Secondary | ICD-10-CM | POA: Diagnosis not present

## 2020-02-25 MED FILL — BETAMETHASONE DP AUG 0.05%: 0.05 | 15 days supply | Qty: 15 | Fill #0

## 2020-03-10 DIAGNOSIS — Z96653 Presence of artificial knee joint, bilateral: Secondary | ICD-10-CM | POA: Diagnosis not present

## 2020-03-10 DIAGNOSIS — Z471 Aftercare following joint replacement surgery: Secondary | ICD-10-CM | POA: Diagnosis not present

## 2020-03-10 DIAGNOSIS — Z96651 Presence of right artificial knee joint: Secondary | ICD-10-CM | POA: Diagnosis not present

## 2020-03-10 DIAGNOSIS — Z96652 Presence of left artificial knee joint: Secondary | ICD-10-CM | POA: Diagnosis not present

## 2020-03-10 MED FILL — DICLOFENAC SODIUM 1% GEL: 1 | 25 days supply | Qty: 400 | Fill #0

## 2020-03-17 DIAGNOSIS — M25552 Pain in left hip: Secondary | ICD-10-CM | POA: Diagnosis not present

## 2020-03-17 MED FILL — AMOXICILLIN 500 MG CAPSULE: 500 | 1 days supply | Qty: 4 | Fill #1

## 2020-03-31 ENCOUNTER — Other Ambulatory Visit: Payer: Self-pay | Admitting: Family Medicine

## 2020-03-31 DIAGNOSIS — Z1231 Encounter for screening mammogram for malignant neoplasm of breast: Secondary | ICD-10-CM

## 2020-04-05 ENCOUNTER — Ambulatory Visit
Admission: RE | Admit: 2020-04-05 | Discharge: 2020-04-05 | Disposition: A | Discharge status: Home / Self Care | Payer: Federal, State, Local not specified - PPO | Source: Ambulatory Visit | Attending: Family Medicine | Admitting: Family Medicine

## 2020-04-05 ENCOUNTER — Other Ambulatory Visit: Payer: Self-pay

## 2020-04-05 DIAGNOSIS — Z1231 Encounter for screening mammogram for malignant neoplasm of breast: Secondary | ICD-10-CM | POA: Diagnosis not present

## 2020-04-07 ENCOUNTER — Other Ambulatory Visit: Payer: Self-pay | Admitting: Family Medicine

## 2020-04-07 DIAGNOSIS — R928 Other abnormal and inconclusive findings on diagnostic imaging of breast: Secondary | ICD-10-CM

## 2020-04-13 ENCOUNTER — Other Ambulatory Visit: Payer: Self-pay

## 2020-04-13 ENCOUNTER — Ambulatory Visit
Admission: RE | Admit: 2020-04-13 | Discharge: 2020-04-13 | Disposition: A | Discharge status: Home / Self Care | Payer: Federal, State, Local not specified - PPO | Source: Ambulatory Visit | Attending: Family Medicine | Admitting: Family Medicine

## 2020-04-13 ENCOUNTER — Other Ambulatory Visit: Payer: Federal, State, Local not specified - PPO

## 2020-04-13 DIAGNOSIS — R922 Inconclusive mammogram: Secondary | ICD-10-CM | POA: Diagnosis not present

## 2020-04-13 DIAGNOSIS — R928 Other abnormal and inconclusive findings on diagnostic imaging of breast: Secondary | ICD-10-CM

## 2020-04-13 DIAGNOSIS — N6002 Solitary cyst of left breast: Secondary | ICD-10-CM | POA: Diagnosis not present

## 2020-05-26 MED FILL — SERTRALINE HCL 50 MG TABS: 50 | 90 days supply | Qty: 90 | Fill #0

## 2020-06-24 DIAGNOSIS — M1612 Unilateral primary osteoarthritis, left hip: Secondary | ICD-10-CM | POA: Diagnosis not present

## 2020-08-09 DIAGNOSIS — M542 Cervicalgia: Secondary | ICD-10-CM | POA: Diagnosis not present

## 2020-08-09 DIAGNOSIS — M5430 Sciatica, unspecified side: Secondary | ICD-10-CM | POA: Diagnosis not present

## 2020-08-09 MED FILL — predniSONE 5 MG TABS: 5 | 6 days supply | Qty: 21 | Fill #0

## 2020-08-09 MED FILL — METHOCARBAMOL 500 MG TABS: 500 | 10 days supply | Qty: 30 | Fill #0

## 2020-08-16 MED FILL — SERTRALINE HCL 50 MG TABLET: 50 | 90 days supply | Qty: 90 | Fill #0

## 2020-08-25 DIAGNOSIS — Z03818 Encounter for observation for suspected exposure to other biological agents ruled out: Secondary | ICD-10-CM | POA: Diagnosis not present

## 2020-10-15 ENCOUNTER — Other Ambulatory Visit (HOSPITAL_BASED_OUTPATIENT_CLINIC_OR_DEPARTMENT_OTHER): Payer: Self-pay | Admitting: Family Medicine

## 2020-10-15 DIAGNOSIS — R251 Tremor, unspecified: Secondary | ICD-10-CM | POA: Diagnosis not present

## 2020-10-15 DIAGNOSIS — Z23 Encounter for immunization: Secondary | ICD-10-CM | POA: Diagnosis not present

## 2020-10-15 DIAGNOSIS — M5432 Sciatica, left side: Secondary | ICD-10-CM | POA: Diagnosis not present

## 2020-10-15 DIAGNOSIS — R1013 Epigastric pain: Secondary | ICD-10-CM | POA: Diagnosis not present

## 2020-10-15 DIAGNOSIS — J3489 Other specified disorders of nose and nasal sinuses: Secondary | ICD-10-CM | POA: Diagnosis not present

## 2020-10-15 DIAGNOSIS — F419 Anxiety disorder, unspecified: Secondary | ICD-10-CM | POA: Diagnosis not present

## 2020-10-15 MED FILL — PANTOPRAZOLE SOD DR 40 MG T: 40 | 30 days supply | Qty: 30 | Fill #0

## 2020-10-15 MED FILL — MUPIROCIN 2% OINTMENT: 2 | 10 days supply | Qty: 22 | Fill #0

## 2020-10-25 DIAGNOSIS — D485 Neoplasm of uncertain behavior of skin: Secondary | ICD-10-CM | POA: Diagnosis not present

## 2020-10-25 DIAGNOSIS — D1801 Hemangioma of skin and subcutaneous tissue: Secondary | ICD-10-CM | POA: Diagnosis not present

## 2020-10-25 DIAGNOSIS — D2371 Other benign neoplasm of skin of right lower limb, including hip: Secondary | ICD-10-CM | POA: Diagnosis not present

## 2020-10-25 DIAGNOSIS — L821 Other seborrheic keratosis: Secondary | ICD-10-CM | POA: Diagnosis not present

## 2020-10-25 DIAGNOSIS — D225 Melanocytic nevi of trunk: Secondary | ICD-10-CM | POA: Diagnosis not present

## 2020-10-25 DIAGNOSIS — L814 Other melanin hyperpigmentation: Secondary | ICD-10-CM | POA: Diagnosis not present

## 2020-11-23 ENCOUNTER — Other Ambulatory Visit (HOSPITAL_BASED_OUTPATIENT_CLINIC_OR_DEPARTMENT_OTHER): Payer: Self-pay | Admitting: Specialist

## 2020-11-23 DIAGNOSIS — D509 Iron deficiency anemia, unspecified: Secondary | ICD-10-CM | POA: Diagnosis not present

## 2020-11-23 DIAGNOSIS — R1013 Epigastric pain: Secondary | ICD-10-CM | POA: Diagnosis not present

## 2020-11-23 DIAGNOSIS — M545 Low back pain, unspecified: Secondary | ICD-10-CM | POA: Diagnosis not present

## 2020-11-23 DIAGNOSIS — M4316 Spondylolisthesis, lumbar region: Secondary | ICD-10-CM | POA: Diagnosis not present

## 2020-11-23 DIAGNOSIS — M4310 Spondylolisthesis, site unspecified: Secondary | ICD-10-CM | POA: Diagnosis not present

## 2020-11-23 DIAGNOSIS — M8580 Other specified disorders of bone density and structure, unspecified site: Secondary | ICD-10-CM | POA: Diagnosis not present

## 2020-11-23 MED FILL — SERTRALINE HCL 50 MG TABLET: 50 | 90 days supply | Qty: 90 | Fill #0

## 2020-11-23 MED FILL — predniSONE 5 MG TABS: 5 | 6 days supply | Qty: 21 | Fill #0

## 2020-11-25 MED FILL — PANTOPRAZOLE SOD DR 40 MG T: 40 | 30 days supply | Qty: 30 | Fill #1

## 2020-11-29 ENCOUNTER — Other Ambulatory Visit (HOSPITAL_BASED_OUTPATIENT_CLINIC_OR_DEPARTMENT_OTHER): Payer: Self-pay | Admitting: Family Medicine

## 2020-11-29 DIAGNOSIS — J209 Acute bronchitis, unspecified: Secondary | ICD-10-CM | POA: Diagnosis not present

## 2020-11-29 MED FILL — AZITHROMYCIN 250 MG TABS: 250 | 5 days supply | Qty: 6 | Fill #0

## 2020-11-29 MED FILL — PROMETHAZINE W/DM SYRUP: 6.25-15 | 5 days supply | Qty: 100 | Fill #0

## 2020-11-30 ENCOUNTER — Other Ambulatory Visit (HOSPITAL_BASED_OUTPATIENT_CLINIC_OR_DEPARTMENT_OTHER): Payer: Self-pay | Admitting: Physician Assistant

## 2020-11-30 DIAGNOSIS — Z20822 Contact with and (suspected) exposure to covid-19: Secondary | ICD-10-CM | POA: Diagnosis not present

## 2020-11-30 DIAGNOSIS — J209 Acute bronchitis, unspecified: Secondary | ICD-10-CM | POA: Diagnosis not present

## 2020-11-30 MED FILL — FERROUS SULFATE 325 MG TAB: 325 (65 FE) | 50 days supply | Qty: 100 | Fill #0

## 2020-12-11 ENCOUNTER — Other Ambulatory Visit: Payer: Self-pay | Admitting: Family Medicine

## 2020-12-11 ENCOUNTER — Other Ambulatory Visit: Payer: Federal, State, Local not specified - PPO

## 2020-12-11 DIAGNOSIS — Z20822 Contact with and (suspected) exposure to covid-19: Secondary | ICD-10-CM | POA: Diagnosis not present

## 2020-12-11 DIAGNOSIS — M8580 Other specified disorders of bone density and structure, unspecified site: Secondary | ICD-10-CM

## 2020-12-14 LAB — NOVEL CORONAVIRUS, NAA: SARS-CoV-2, NAA: NOT DETECTED

## 2021-01-05 ENCOUNTER — Other Ambulatory Visit: Payer: Self-pay | Admitting: Family Medicine

## 2021-01-05 DIAGNOSIS — M8580 Other specified disorders of bone density and structure, unspecified site: Secondary | ICD-10-CM

## 2021-01-07 MED FILL — PANTOPRAZOLE SOD DR 40 MG T: 40 | 30 days supply | Qty: 30 | Fill #2

## 2021-01-14 ENCOUNTER — Ambulatory Visit
Admission: RE | Admit: 2021-01-14 | Discharge: 2021-01-14 | Disposition: A | Discharge status: Home / Self Care | Payer: Federal, State, Local not specified - PPO | Source: Ambulatory Visit | Attending: Family Medicine | Admitting: Family Medicine

## 2021-01-14 ENCOUNTER — Other Ambulatory Visit: Payer: Self-pay

## 2021-01-14 DIAGNOSIS — Z780 Asymptomatic menopausal state: Secondary | ICD-10-CM | POA: Diagnosis not present

## 2021-01-14 DIAGNOSIS — M8580 Other specified disorders of bone density and structure, unspecified site: Secondary | ICD-10-CM

## 2021-01-14 DIAGNOSIS — M85852 Other specified disorders of bone density and structure, left thigh: Secondary | ICD-10-CM | POA: Diagnosis not present

## 2021-01-27 DIAGNOSIS — R1013 Epigastric pain: Secondary | ICD-10-CM | POA: Diagnosis not present

## 2021-01-27 DIAGNOSIS — K219 Gastro-esophageal reflux disease without esophagitis: Secondary | ICD-10-CM | POA: Diagnosis not present

## 2021-01-27 DIAGNOSIS — D509 Iron deficiency anemia, unspecified: Secondary | ICD-10-CM | POA: Diagnosis not present

## 2021-02-03 DIAGNOSIS — Z01812 Encounter for preprocedural laboratory examination: Secondary | ICD-10-CM | POA: Diagnosis not present

## 2021-02-08 DIAGNOSIS — K449 Diaphragmatic hernia without obstruction or gangrene: Secondary | ICD-10-CM | POA: Diagnosis not present

## 2021-02-08 DIAGNOSIS — D509 Iron deficiency anemia, unspecified: Secondary | ICD-10-CM | POA: Diagnosis not present

## 2021-02-08 DIAGNOSIS — Z8371 Family history of colonic polyps: Secondary | ICD-10-CM | POA: Diagnosis not present

## 2021-02-08 DIAGNOSIS — K317 Polyp of stomach and duodenum: Secondary | ICD-10-CM | POA: Diagnosis not present

## 2021-02-08 DIAGNOSIS — K219 Gastro-esophageal reflux disease without esophagitis: Secondary | ICD-10-CM | POA: Diagnosis not present

## 2021-02-08 DIAGNOSIS — K5730 Diverticulosis of large intestine without perforation or abscess without bleeding: Secondary | ICD-10-CM | POA: Diagnosis not present

## 2021-02-16 MED FILL — SERTRALINE HCL 50 MG TABS: 50 | 90 days supply | Qty: 90 | Fill #1

## 2021-02-22 MED FILL — PANTOPRAZOLE SOD DR 40 MG T: 40 | 30 days supply | Qty: 30 | Fill #3

## 2021-04-27 ENCOUNTER — Other Ambulatory Visit: Payer: Federal, State, Local not specified - PPO

## 2021-05-13 ENCOUNTER — Other Ambulatory Visit (HOSPITAL_BASED_OUTPATIENT_CLINIC_OR_DEPARTMENT_OTHER): Payer: Self-pay

## 2021-05-13 MED ORDER — METHOCARBAMOL 500 MG PO TABS
ORAL_TABLET | ORAL | 1 refills | Status: AC
  Filled 2021-05-13: qty 30, 30d supply, fill #0
  Filled 2021-08-10: qty 30, 30d supply, fill #1

## 2021-05-16 ENCOUNTER — Other Ambulatory Visit (HOSPITAL_BASED_OUTPATIENT_CLINIC_OR_DEPARTMENT_OTHER): Payer: Self-pay

## 2021-05-17 ENCOUNTER — Other Ambulatory Visit (HOSPITAL_BASED_OUTPATIENT_CLINIC_OR_DEPARTMENT_OTHER): Payer: Self-pay

## 2021-05-23 ENCOUNTER — Other Ambulatory Visit (HOSPITAL_BASED_OUTPATIENT_CLINIC_OR_DEPARTMENT_OTHER): Payer: Self-pay

## 2021-05-23 MED ORDER — SERTRALINE HCL 50 MG PO TABS
ORAL_TABLET | ORAL | 0 refills | Status: DC
  Filled 2021-05-23: qty 90, 90d supply, fill #0

## 2021-05-24 ENCOUNTER — Other Ambulatory Visit (HOSPITAL_BASED_OUTPATIENT_CLINIC_OR_DEPARTMENT_OTHER): Payer: Self-pay

## 2021-05-24 MED ORDER — PANTOPRAZOLE SODIUM 40 MG PO TBEC
DELAYED_RELEASE_TABLET | ORAL | 0 refills | Status: AC
  Filled 2021-05-24: qty 30, 30d supply, fill #0

## 2021-06-09 DIAGNOSIS — M4316 Spondylolisthesis, lumbar region: Secondary | ICD-10-CM | POA: Diagnosis not present

## 2021-06-13 DIAGNOSIS — M4316 Spondylolisthesis, lumbar region: Secondary | ICD-10-CM | POA: Diagnosis not present

## 2021-06-15 DIAGNOSIS — M4316 Spondylolisthesis, lumbar region: Secondary | ICD-10-CM | POA: Diagnosis not present

## 2021-07-07 DIAGNOSIS — M4316 Spondylolisthesis, lumbar region: Secondary | ICD-10-CM | POA: Diagnosis not present

## 2021-07-11 DIAGNOSIS — M4316 Spondylolisthesis, lumbar region: Secondary | ICD-10-CM | POA: Diagnosis not present

## 2021-07-12 DIAGNOSIS — M160 Bilateral primary osteoarthritis of hip: Secondary | ICD-10-CM | POA: Diagnosis not present

## 2021-07-12 DIAGNOSIS — M7061 Trochanteric bursitis, right hip: Secondary | ICD-10-CM | POA: Diagnosis not present

## 2021-07-12 DIAGNOSIS — M7062 Trochanteric bursitis, left hip: Secondary | ICD-10-CM | POA: Diagnosis not present

## 2021-07-12 DIAGNOSIS — M25552 Pain in left hip: Secondary | ICD-10-CM | POA: Diagnosis not present

## 2021-07-13 DIAGNOSIS — M4316 Spondylolisthesis, lumbar region: Secondary | ICD-10-CM | POA: Diagnosis not present

## 2021-07-18 DIAGNOSIS — M4316 Spondylolisthesis, lumbar region: Secondary | ICD-10-CM | POA: Diagnosis not present

## 2021-07-28 DIAGNOSIS — M4316 Spondylolisthesis, lumbar region: Secondary | ICD-10-CM | POA: Diagnosis not present

## 2021-08-04 DIAGNOSIS — M4316 Spondylolisthesis, lumbar region: Secondary | ICD-10-CM | POA: Diagnosis not present

## 2021-08-10 ENCOUNTER — Other Ambulatory Visit (HOSPITAL_BASED_OUTPATIENT_CLINIC_OR_DEPARTMENT_OTHER): Payer: Self-pay

## 2021-08-10 DIAGNOSIS — M4316 Spondylolisthesis, lumbar region: Secondary | ICD-10-CM | POA: Diagnosis not present

## 2021-08-10 MED ORDER — SERTRALINE HCL 50 MG PO TABS
ORAL_TABLET | ORAL | 0 refills | Status: DC
  Filled 2021-08-10: qty 30, 30d supply, fill #0

## 2021-08-26 DIAGNOSIS — M25551 Pain in right hip: Secondary | ICD-10-CM | POA: Diagnosis not present

## 2021-08-26 DIAGNOSIS — M25552 Pain in left hip: Secondary | ICD-10-CM | POA: Diagnosis not present

## 2021-09-20 ENCOUNTER — Other Ambulatory Visit (HOSPITAL_BASED_OUTPATIENT_CLINIC_OR_DEPARTMENT_OTHER): Payer: Self-pay

## 2021-09-21 ENCOUNTER — Other Ambulatory Visit (HOSPITAL_BASED_OUTPATIENT_CLINIC_OR_DEPARTMENT_OTHER): Payer: Self-pay

## 2021-09-21 DIAGNOSIS — M25552 Pain in left hip: Secondary | ICD-10-CM | POA: Diagnosis not present

## 2021-09-21 DIAGNOSIS — M25551 Pain in right hip: Secondary | ICD-10-CM | POA: Diagnosis not present

## 2021-09-21 MED ORDER — SERTRALINE HCL 50 MG PO TABS
50.0000 mg | ORAL_TABLET | Freq: Every day | ORAL | 0 refills | Status: DC
  Filled 2021-09-21: qty 30, 30d supply, fill #0

## 2021-10-11 ENCOUNTER — Other Ambulatory Visit (HOSPITAL_BASED_OUTPATIENT_CLINIC_OR_DEPARTMENT_OTHER): Payer: Self-pay

## 2021-10-11 MED ORDER — PROMETHAZINE-DM 6.25-15 MG/5ML PO SYRP
ORAL_SOLUTION | ORAL | 0 refills | Status: AC
  Filled 2021-10-11: qty 100, 5d supply, fill #0

## 2021-10-14 ENCOUNTER — Ambulatory Visit
Admission: RE | Admit: 2021-10-14 | Discharge: 2021-10-14 | Disposition: A | Discharge status: Home / Self Care | Payer: Federal, State, Local not specified - PPO | Source: Ambulatory Visit | Attending: Family Medicine | Admitting: Family Medicine

## 2021-10-14 ENCOUNTER — Other Ambulatory Visit: Payer: Self-pay | Admitting: Family Medicine

## 2021-10-14 DIAGNOSIS — Z1231 Encounter for screening mammogram for malignant neoplasm of breast: Secondary | ICD-10-CM

## 2021-10-17 ENCOUNTER — Other Ambulatory Visit: Payer: Self-pay | Admitting: Family Medicine

## 2021-10-17 DIAGNOSIS — N6320 Unspecified lump in the left breast, unspecified quadrant: Secondary | ICD-10-CM

## 2021-10-19 ENCOUNTER — Other Ambulatory Visit (HOSPITAL_BASED_OUTPATIENT_CLINIC_OR_DEPARTMENT_OTHER): Payer: Self-pay

## 2021-10-19 DIAGNOSIS — E7800 Pure hypercholesterolemia, unspecified: Secondary | ICD-10-CM | POA: Diagnosis not present

## 2021-10-19 DIAGNOSIS — F411 Generalized anxiety disorder: Secondary | ICD-10-CM | POA: Diagnosis not present

## 2021-10-19 DIAGNOSIS — D509 Iron deficiency anemia, unspecified: Secondary | ICD-10-CM | POA: Diagnosis not present

## 2021-10-19 DIAGNOSIS — M549 Dorsalgia, unspecified: Secondary | ICD-10-CM | POA: Diagnosis not present

## 2021-10-19 DIAGNOSIS — Z23 Encounter for immunization: Secondary | ICD-10-CM | POA: Diagnosis not present

## 2021-10-19 MED ORDER — SERTRALINE HCL 50 MG PO TABS
50.0000 mg | ORAL_TABLET | Freq: Every day | ORAL | 3 refills | Status: DC
  Filled 2021-10-19: qty 90, 90d supply, fill #0
  Filled 2022-01-18: qty 90, 90d supply, fill #1
  Filled 2022-04-20: qty 90, 90d supply, fill #2
  Filled 2022-07-19: qty 90, 90d supply, fill #3

## 2021-10-19 MED ORDER — METHOCARBAMOL 500 MG PO TABS
500.0000 mg | ORAL_TABLET | Freq: Two times a day (BID) | ORAL | 1 refills | Status: DC | PRN
  Filled 2021-10-19: qty 90, 45d supply, fill #0
  Filled 2022-05-09: qty 90, 45d supply, fill #1

## 2021-10-27 DIAGNOSIS — D225 Melanocytic nevi of trunk: Secondary | ICD-10-CM | POA: Diagnosis not present

## 2021-10-27 DIAGNOSIS — L814 Other melanin hyperpigmentation: Secondary | ICD-10-CM | POA: Diagnosis not present

## 2021-10-27 DIAGNOSIS — D1801 Hemangioma of skin and subcutaneous tissue: Secondary | ICD-10-CM | POA: Diagnosis not present

## 2021-10-27 DIAGNOSIS — L821 Other seborrheic keratosis: Secondary | ICD-10-CM | POA: Diagnosis not present

## 2021-10-28 ENCOUNTER — Other Ambulatory Visit (HOSPITAL_BASED_OUTPATIENT_CLINIC_OR_DEPARTMENT_OTHER): Payer: Self-pay

## 2021-11-10 ENCOUNTER — Other Ambulatory Visit: Payer: Federal, State, Local not specified - PPO

## 2021-11-10 ENCOUNTER — Ambulatory Visit
Admission: RE | Admit: 2021-11-10 | Discharge: 2021-11-10 | Disposition: A | Discharge status: Home / Self Care | Payer: Federal, State, Local not specified - PPO | Source: Ambulatory Visit | Attending: Family Medicine | Admitting: Family Medicine

## 2021-11-10 DIAGNOSIS — N6320 Unspecified lump in the left breast, unspecified quadrant: Secondary | ICD-10-CM

## 2021-11-10 DIAGNOSIS — N6321 Unspecified lump in the left breast, upper outer quadrant: Secondary | ICD-10-CM | POA: Diagnosis not present

## 2021-11-10 DIAGNOSIS — N644 Mastodynia: Secondary | ICD-10-CM | POA: Diagnosis not present

## 2021-11-10 DIAGNOSIS — R922 Inconclusive mammogram: Secondary | ICD-10-CM | POA: Diagnosis not present

## 2021-11-15 ENCOUNTER — Other Ambulatory Visit: Payer: Federal, State, Local not specified - PPO

## 2022-01-18 ENCOUNTER — Other Ambulatory Visit (HOSPITAL_BASED_OUTPATIENT_CLINIC_OR_DEPARTMENT_OTHER): Payer: Self-pay

## 2022-02-06 ENCOUNTER — Other Ambulatory Visit (HOSPITAL_BASED_OUTPATIENT_CLINIC_OR_DEPARTMENT_OTHER): Payer: Self-pay

## 2022-02-06 DIAGNOSIS — J0110 Acute frontal sinusitis, unspecified: Secondary | ICD-10-CM | POA: Diagnosis not present

## 2022-02-06 DIAGNOSIS — G4489 Other headache syndrome: Secondary | ICD-10-CM | POA: Diagnosis not present

## 2022-02-06 MED ORDER — MECLIZINE HCL 12.5 MG PO TABS
12.5000 mg | ORAL_TABLET | Freq: Two times a day (BID) | ORAL | 1 refills | Status: AC
  Filled 2022-02-06: qty 20, 10d supply, fill #0

## 2022-02-06 MED ORDER — AZITHROMYCIN 250 MG PO TABS
ORAL_TABLET | ORAL | 0 refills | Status: AC
  Filled 2022-02-06: qty 6, 5d supply, fill #0

## 2022-02-06 MED ORDER — NAPROXEN 500 MG PO TABS
500.0000 mg | ORAL_TABLET | Freq: Two times a day (BID) | ORAL | 1 refills | Status: AC
  Filled 2022-02-06: qty 20, 10d supply, fill #0
  Filled 2022-04-20: qty 20, 10d supply, fill #1

## 2022-02-21 ENCOUNTER — Other Ambulatory Visit (HOSPITAL_BASED_OUTPATIENT_CLINIC_OR_DEPARTMENT_OTHER): Payer: Self-pay

## 2022-03-14 DIAGNOSIS — D2371 Other benign neoplasm of skin of right lower limb, including hip: Secondary | ICD-10-CM | POA: Diagnosis not present

## 2022-03-14 DIAGNOSIS — L820 Inflamed seborrheic keratosis: Secondary | ICD-10-CM | POA: Diagnosis not present

## 2022-03-14 DIAGNOSIS — L821 Other seborrheic keratosis: Secondary | ICD-10-CM | POA: Diagnosis not present

## 2022-04-20 ENCOUNTER — Other Ambulatory Visit (HOSPITAL_BASED_OUTPATIENT_CLINIC_OR_DEPARTMENT_OTHER): Payer: Self-pay

## 2022-05-09 ENCOUNTER — Other Ambulatory Visit (HOSPITAL_BASED_OUTPATIENT_CLINIC_OR_DEPARTMENT_OTHER): Payer: Self-pay

## 2022-05-10 DIAGNOSIS — M25552 Pain in left hip: Secondary | ICD-10-CM | POA: Diagnosis not present

## 2022-05-10 DIAGNOSIS — M545 Low back pain, unspecified: Secondary | ICD-10-CM | POA: Diagnosis not present

## 2022-05-10 DIAGNOSIS — M1611 Unilateral primary osteoarthritis, right hip: Secondary | ICD-10-CM | POA: Diagnosis not present

## 2022-05-10 DIAGNOSIS — M25551 Pain in right hip: Secondary | ICD-10-CM | POA: Diagnosis not present

## 2022-06-08 ENCOUNTER — Other Ambulatory Visit (HOSPITAL_BASED_OUTPATIENT_CLINIC_OR_DEPARTMENT_OTHER): Payer: Self-pay

## 2022-06-08 DIAGNOSIS — M5459 Other low back pain: Secondary | ICD-10-CM | POA: Diagnosis not present

## 2022-06-08 MED ORDER — GABAPENTIN 300 MG PO CAPS
ORAL_CAPSULE | ORAL | 2 refills | Status: DC
  Filled 2022-06-08: qty 30, 30d supply, fill #0
  Filled 2022-07-10: qty 30, 30d supply, fill #1
  Filled 2022-09-11: qty 30, 30d supply, fill #2

## 2022-06-08 MED ORDER — PREDNISONE 5 MG PO TABS
ORAL_TABLET | ORAL | 1 refills | Status: AC
  Filled 2022-06-08: qty 21, 6d supply, fill #0

## 2022-06-12 DIAGNOSIS — Z131 Encounter for screening for diabetes mellitus: Secondary | ICD-10-CM | POA: Diagnosis not present

## 2022-06-12 DIAGNOSIS — M1611 Unilateral primary osteoarthritis, right hip: Secondary | ICD-10-CM | POA: Diagnosis not present

## 2022-06-12 DIAGNOSIS — D509 Iron deficiency anemia, unspecified: Secondary | ICD-10-CM | POA: Diagnosis not present

## 2022-06-12 DIAGNOSIS — E785 Hyperlipidemia, unspecified: Secondary | ICD-10-CM | POA: Diagnosis not present

## 2022-06-17 DIAGNOSIS — M5416 Radiculopathy, lumbar region: Secondary | ICD-10-CM | POA: Diagnosis not present

## 2022-06-22 DIAGNOSIS — M1611 Unilateral primary osteoarthritis, right hip: Secondary | ICD-10-CM | POA: Diagnosis not present

## 2022-06-26 ENCOUNTER — Other Ambulatory Visit (HOSPITAL_BASED_OUTPATIENT_CLINIC_OR_DEPARTMENT_OTHER): Payer: Self-pay

## 2022-06-26 MED ORDER — ASPIRIN 325 MG PO TABS
ORAL_TABLET | ORAL | 0 refills | Status: AC
  Filled 2022-07-10: qty 100, 50d supply, fill #0

## 2022-06-26 MED ORDER — METHOCARBAMOL 500 MG PO TABS
ORAL_TABLET | ORAL | 0 refills | Status: AC
  Filled 2022-07-10: qty 40, 10d supply, fill #0

## 2022-06-26 MED ORDER — ONDANSETRON HCL 4 MG PO TABS
ORAL_TABLET | ORAL | 1 refills | Status: AC
  Filled 2022-07-10: qty 20, 5d supply, fill #0

## 2022-06-26 MED ORDER — OXYCODONE HCL 5 MG PO TABS
ORAL_TABLET | ORAL | 0 refills | Status: AC
  Filled 2022-07-10: qty 28, 7d supply, fill #0

## 2022-06-28 DIAGNOSIS — M545 Low back pain, unspecified: Secondary | ICD-10-CM | POA: Diagnosis not present

## 2022-07-06 DIAGNOSIS — M5416 Radiculopathy, lumbar region: Secondary | ICD-10-CM | POA: Diagnosis not present

## 2022-07-10 ENCOUNTER — Other Ambulatory Visit (HOSPITAL_BASED_OUTPATIENT_CLINIC_OR_DEPARTMENT_OTHER): Payer: Self-pay

## 2022-07-11 DIAGNOSIS — Z96641 Presence of right artificial hip joint: Secondary | ICD-10-CM | POA: Diagnosis not present

## 2022-07-11 DIAGNOSIS — M1611 Unilateral primary osteoarthritis, right hip: Secondary | ICD-10-CM | POA: Diagnosis not present

## 2022-07-19 ENCOUNTER — Other Ambulatory Visit (HOSPITAL_BASED_OUTPATIENT_CLINIC_OR_DEPARTMENT_OTHER): Payer: Self-pay

## 2022-08-04 ENCOUNTER — Other Ambulatory Visit (HOSPITAL_BASED_OUTPATIENT_CLINIC_OR_DEPARTMENT_OTHER): Payer: Self-pay

## 2022-08-15 DIAGNOSIS — Z5189 Encounter for other specified aftercare: Secondary | ICD-10-CM | POA: Diagnosis not present

## 2022-09-07 ENCOUNTER — Other Ambulatory Visit (HOSPITAL_BASED_OUTPATIENT_CLINIC_OR_DEPARTMENT_OTHER): Payer: Self-pay

## 2022-09-11 ENCOUNTER — Other Ambulatory Visit (HOSPITAL_BASED_OUTPATIENT_CLINIC_OR_DEPARTMENT_OTHER): Payer: Self-pay

## 2022-09-12 ENCOUNTER — Other Ambulatory Visit (HOSPITAL_BASED_OUTPATIENT_CLINIC_OR_DEPARTMENT_OTHER): Payer: Self-pay

## 2022-09-13 ENCOUNTER — Other Ambulatory Visit (HOSPITAL_BASED_OUTPATIENT_CLINIC_OR_DEPARTMENT_OTHER): Payer: Self-pay

## 2022-09-13 MED ORDER — METHOCARBAMOL 500 MG PO TABS
500.0000 mg | ORAL_TABLET | Freq: Two times a day (BID) | ORAL | 0 refills | Status: AC | PRN
  Filled 2022-09-13: qty 90, 45d supply, fill #0

## 2022-09-26 ENCOUNTER — Other Ambulatory Visit (HOSPITAL_BASED_OUTPATIENT_CLINIC_OR_DEPARTMENT_OTHER): Payer: Self-pay

## 2022-09-27 ENCOUNTER — Other Ambulatory Visit (HOSPITAL_BASED_OUTPATIENT_CLINIC_OR_DEPARTMENT_OTHER): Payer: Self-pay

## 2022-09-27 MED ORDER — DICLOFENAC SODIUM 3 % EX GEL
1.0000 "application " | Freq: Two times a day (BID) | CUTANEOUS | 1 refills | Status: AC
  Filled 2022-09-27: qty 300, 90d supply, fill #0

## 2022-09-27 MED ORDER — METHYLPREDNISOLONE 4 MG PO TBPK
ORAL_TABLET | ORAL | 0 refills | Status: AC
  Filled 2022-09-27: qty 21, 6d supply, fill #0

## 2022-10-02 ENCOUNTER — Other Ambulatory Visit (HOSPITAL_BASED_OUTPATIENT_CLINIC_OR_DEPARTMENT_OTHER): Payer: Self-pay

## 2022-10-03 ENCOUNTER — Other Ambulatory Visit (HOSPITAL_BASED_OUTPATIENT_CLINIC_OR_DEPARTMENT_OTHER): Payer: Self-pay

## 2022-10-06 ENCOUNTER — Other Ambulatory Visit (HOSPITAL_BASED_OUTPATIENT_CLINIC_OR_DEPARTMENT_OTHER): Payer: Self-pay

## 2022-10-12 ENCOUNTER — Other Ambulatory Visit (HOSPITAL_BASED_OUTPATIENT_CLINIC_OR_DEPARTMENT_OTHER): Payer: Self-pay

## 2022-10-12 MED ORDER — GABAPENTIN 300 MG PO CAPS
300.0000 mg | ORAL_CAPSULE | Freq: Every evening | ORAL | 2 refills | Status: AC
  Filled 2022-10-12: qty 30, 30d supply, fill #0

## 2022-10-13 ENCOUNTER — Other Ambulatory Visit (HOSPITAL_BASED_OUTPATIENT_CLINIC_OR_DEPARTMENT_OTHER): Payer: Self-pay

## 2022-10-13 MED ORDER — SERTRALINE HCL 50 MG PO TABS
50.0000 mg | ORAL_TABLET | Freq: Every day | ORAL | 0 refills | Status: DC
  Filled 2022-10-13: qty 90, 90d supply, fill #0

## 2022-10-23 ENCOUNTER — Other Ambulatory Visit (HOSPITAL_BASED_OUTPATIENT_CLINIC_OR_DEPARTMENT_OTHER): Payer: Self-pay

## 2022-10-23 DIAGNOSIS — D509 Iron deficiency anemia, unspecified: Secondary | ICD-10-CM | POA: Diagnosis not present

## 2022-10-23 DIAGNOSIS — F419 Anxiety disorder, unspecified: Secondary | ICD-10-CM | POA: Diagnosis not present

## 2022-10-23 DIAGNOSIS — Z23 Encounter for immunization: Secondary | ICD-10-CM | POA: Diagnosis not present

## 2022-10-23 DIAGNOSIS — M5432 Sciatica, left side: Secondary | ICD-10-CM | POA: Diagnosis not present

## 2022-10-23 DIAGNOSIS — E7800 Pure hypercholesterolemia, unspecified: Secondary | ICD-10-CM | POA: Diagnosis not present

## 2022-10-23 MED ORDER — DICLOFENAC SODIUM 1 % EX GEL
4.0000 g | Freq: Four times a day (QID) | CUTANEOUS | 3 refills | Status: AC | PRN
  Filled 2022-10-23: qty 300, 19d supply, fill #0
  Filled 2023-06-04: qty 300, 19d supply, fill #1

## 2022-10-23 MED ORDER — LIDOCAINE 5 % EX PTCH
1.0000 | MEDICATED_PATCH | Freq: Every day | CUTANEOUS | 5 refills | Status: AC
  Filled 2022-10-23: qty 30, 30d supply, fill #0

## 2022-10-30 DIAGNOSIS — L814 Other melanin hyperpigmentation: Secondary | ICD-10-CM | POA: Diagnosis not present

## 2022-10-30 DIAGNOSIS — D1801 Hemangioma of skin and subcutaneous tissue: Secondary | ICD-10-CM | POA: Diagnosis not present

## 2022-10-30 DIAGNOSIS — L821 Other seborrheic keratosis: Secondary | ICD-10-CM | POA: Diagnosis not present

## 2022-10-30 DIAGNOSIS — D2371 Other benign neoplasm of skin of right lower limb, including hip: Secondary | ICD-10-CM | POA: Diagnosis not present

## 2022-11-13 ENCOUNTER — Other Ambulatory Visit (HOSPITAL_BASED_OUTPATIENT_CLINIC_OR_DEPARTMENT_OTHER): Payer: Self-pay

## 2022-11-13 MED ORDER — AMOXICILLIN 500 MG PO CAPS
2000.0000 mg | ORAL_CAPSULE | Freq: Once | ORAL | 2 refills | Status: AC
  Filled 2022-11-13: qty 4, 1d supply, fill #0
  Filled 2023-01-29 – 2023-01-30 (×2): qty 4, 1d supply, fill #1
  Filled 2023-06-04: qty 4, 1d supply, fill #2

## 2022-11-15 DIAGNOSIS — K080 Exfoliation of teeth due to systemic causes: Secondary | ICD-10-CM | POA: Diagnosis not present

## 2022-11-22 DIAGNOSIS — M25552 Pain in left hip: Secondary | ICD-10-CM | POA: Diagnosis not present

## 2023-01-15 ENCOUNTER — Other Ambulatory Visit (HOSPITAL_BASED_OUTPATIENT_CLINIC_OR_DEPARTMENT_OTHER): Payer: Self-pay

## 2023-01-17 ENCOUNTER — Other Ambulatory Visit (HOSPITAL_BASED_OUTPATIENT_CLINIC_OR_DEPARTMENT_OTHER): Payer: Self-pay

## 2023-01-17 MED ORDER — SERTRALINE HCL 50 MG PO TABS
50.0000 mg | ORAL_TABLET | Freq: Every day | ORAL | 1 refills | Status: DC
  Filled 2023-01-17: qty 90, 90d supply, fill #0
  Filled 2023-04-12: qty 90, 90d supply, fill #1

## 2023-01-30 ENCOUNTER — Other Ambulatory Visit (HOSPITAL_BASED_OUTPATIENT_CLINIC_OR_DEPARTMENT_OTHER): Payer: Self-pay

## 2023-02-20 DIAGNOSIS — Z0189 Encounter for other specified special examinations: Secondary | ICD-10-CM | POA: Diagnosis not present

## 2023-02-20 DIAGNOSIS — Z79899 Other long term (current) drug therapy: Secondary | ICD-10-CM | POA: Diagnosis not present

## 2023-02-20 DIAGNOSIS — Z5181 Encounter for therapeutic drug level monitoring: Secondary | ICD-10-CM | POA: Diagnosis not present

## 2023-03-08 ENCOUNTER — Other Ambulatory Visit (HOSPITAL_BASED_OUTPATIENT_CLINIC_OR_DEPARTMENT_OTHER): Payer: Self-pay

## 2023-03-08 MED ORDER — METHOCARBAMOL 500 MG PO TABS
500.0000 mg | ORAL_TABLET | Freq: Four times a day (QID) | ORAL | 0 refills | Status: DC | PRN
  Filled 2023-03-08: qty 40, 10d supply, fill #0

## 2023-03-08 MED ORDER — ASPIRIN 81 MG PO CHEW
81.0000 mg | CHEWABLE_TABLET | Freq: Two times a day (BID) | ORAL | 0 refills | Status: AC
  Filled 2023-03-08: qty 72, 36d supply, fill #0

## 2023-03-08 MED ORDER — OXYCODONE HCL 5 MG PO TABS
5.0000 mg | ORAL_TABLET | Freq: Four times a day (QID) | ORAL | 0 refills | Status: AC | PRN
  Filled 2023-03-08: qty 28, 7d supply, fill #0

## 2023-03-13 DIAGNOSIS — M1612 Unilateral primary osteoarthritis, left hip: Secondary | ICD-10-CM | POA: Diagnosis not present

## 2023-04-18 ENCOUNTER — Other Ambulatory Visit (HOSPITAL_BASED_OUTPATIENT_CLINIC_OR_DEPARTMENT_OTHER): Payer: Self-pay

## 2023-04-18 DIAGNOSIS — M1612 Unilateral primary osteoarthritis, left hip: Secondary | ICD-10-CM | POA: Diagnosis not present

## 2023-04-18 MED ORDER — METHOCARBAMOL 500 MG PO TABS
500.0000 mg | ORAL_TABLET | Freq: Four times a day (QID) | ORAL | 0 refills | Status: AC | PRN
  Filled 2023-04-18: qty 40, 10d supply, fill #0

## 2023-06-05 ENCOUNTER — Other Ambulatory Visit (HOSPITAL_BASED_OUTPATIENT_CLINIC_OR_DEPARTMENT_OTHER): Payer: Self-pay

## 2023-06-05 ENCOUNTER — Other Ambulatory Visit: Payer: Self-pay

## 2023-07-11 ENCOUNTER — Other Ambulatory Visit (HOSPITAL_BASED_OUTPATIENT_CLINIC_OR_DEPARTMENT_OTHER): Payer: Self-pay

## 2023-07-12 ENCOUNTER — Other Ambulatory Visit (HOSPITAL_BASED_OUTPATIENT_CLINIC_OR_DEPARTMENT_OTHER): Payer: Self-pay

## 2023-07-12 MED ORDER — SERTRALINE HCL 50 MG PO TABS
50.0000 mg | ORAL_TABLET | Freq: Every day | ORAL | 1 refills | Status: DC
  Filled 2023-07-12: qty 90, 90d supply, fill #0
  Filled 2023-10-11: qty 90, 90d supply, fill #1

## 2023-07-13 ENCOUNTER — Other Ambulatory Visit: Payer: Self-pay | Admitting: Family Medicine

## 2023-07-13 DIAGNOSIS — Z1231 Encounter for screening mammogram for malignant neoplasm of breast: Secondary | ICD-10-CM

## 2023-07-18 ENCOUNTER — Other Ambulatory Visit: Payer: Self-pay | Admitting: Family Medicine

## 2023-07-18 DIAGNOSIS — N644 Mastodynia: Secondary | ICD-10-CM

## 2023-08-27 ENCOUNTER — Other Ambulatory Visit: Payer: Federal, State, Local not specified - PPO

## 2023-09-04 ENCOUNTER — Ambulatory Visit
Admission: RE | Admit: 2023-09-04 | Discharge: 2023-09-04 | Disposition: A | Discharge status: Home / Self Care | Payer: Federal, State, Local not specified - PPO | Source: Ambulatory Visit | Attending: Family Medicine | Admitting: Family Medicine

## 2023-09-04 DIAGNOSIS — N644 Mastodynia: Secondary | ICD-10-CM | POA: Diagnosis not present

## 2023-09-04 DIAGNOSIS — N6002 Solitary cyst of left breast: Secondary | ICD-10-CM | POA: Diagnosis not present

## 2023-10-11 ENCOUNTER — Other Ambulatory Visit (HOSPITAL_BASED_OUTPATIENT_CLINIC_OR_DEPARTMENT_OTHER): Payer: Self-pay

## 2023-10-11 DIAGNOSIS — Z96641 Presence of right artificial hip joint: Secondary | ICD-10-CM | POA: Diagnosis not present

## 2023-10-11 MED ORDER — METHOCARBAMOL 500 MG PO TABS
500.0000 mg | ORAL_TABLET | Freq: Two times a day (BID) | ORAL | 0 refills | Status: AC
  Filled 2023-10-11: qty 30, 15d supply, fill #0

## 2023-10-15 ENCOUNTER — Other Ambulatory Visit (HOSPITAL_BASED_OUTPATIENT_CLINIC_OR_DEPARTMENT_OTHER): Payer: Self-pay

## 2023-10-29 ENCOUNTER — Other Ambulatory Visit (HOSPITAL_BASED_OUTPATIENT_CLINIC_OR_DEPARTMENT_OTHER): Payer: Self-pay

## 2023-10-31 DIAGNOSIS — L820 Inflamed seborrheic keratosis: Secondary | ICD-10-CM | POA: Diagnosis not present

## 2023-10-31 DIAGNOSIS — L814 Other melanin hyperpigmentation: Secondary | ICD-10-CM | POA: Diagnosis not present

## 2023-10-31 DIAGNOSIS — D1801 Hemangioma of skin and subcutaneous tissue: Secondary | ICD-10-CM | POA: Diagnosis not present

## 2023-10-31 DIAGNOSIS — L821 Other seborrheic keratosis: Secondary | ICD-10-CM | POA: Diagnosis not present

## 2023-11-08 DIAGNOSIS — D509 Iron deficiency anemia, unspecified: Secondary | ICD-10-CM | POA: Diagnosis not present

## 2023-11-08 DIAGNOSIS — Z0000 Encounter for general adult medical examination without abnormal findings: Secondary | ICD-10-CM | POA: Diagnosis not present

## 2023-11-08 DIAGNOSIS — E7800 Pure hypercholesterolemia, unspecified: Secondary | ICD-10-CM | POA: Diagnosis not present

## 2023-11-08 DIAGNOSIS — R7303 Prediabetes: Secondary | ICD-10-CM | POA: Diagnosis not present

## 2023-11-08 DIAGNOSIS — F419 Anxiety disorder, unspecified: Secondary | ICD-10-CM | POA: Diagnosis not present

## 2023-11-13 ENCOUNTER — Other Ambulatory Visit: Payer: Self-pay | Admitting: Family Medicine

## 2023-11-13 DIAGNOSIS — M85852 Other specified disorders of bone density and structure, left thigh: Secondary | ICD-10-CM

## 2023-11-23 ENCOUNTER — Other Ambulatory Visit (HOSPITAL_BASED_OUTPATIENT_CLINIC_OR_DEPARTMENT_OTHER): Payer: Self-pay

## 2023-11-23 MED ORDER — PROMETHAZINE-DM 6.25-15 MG/5ML PO SYRP
5.0000 mL | ORAL_SOLUTION | Freq: Four times a day (QID) | ORAL | 0 refills | Status: AC | PRN
  Filled 2023-11-23: qty 200, 10d supply, fill #0

## 2024-01-03 ENCOUNTER — Other Ambulatory Visit (HOSPITAL_BASED_OUTPATIENT_CLINIC_OR_DEPARTMENT_OTHER): Payer: Self-pay

## 2024-01-03 MED ORDER — AMOXICILLIN 500 MG PO CAPS
2000.0000 mg | ORAL_CAPSULE | ORAL | 2 refills | Status: AC
  Filled 2024-01-03: qty 20, 5d supply, fill #0

## 2024-01-07 ENCOUNTER — Other Ambulatory Visit (HOSPITAL_BASED_OUTPATIENT_CLINIC_OR_DEPARTMENT_OTHER): Payer: Self-pay

## 2024-01-08 ENCOUNTER — Other Ambulatory Visit (HOSPITAL_BASED_OUTPATIENT_CLINIC_OR_DEPARTMENT_OTHER): Payer: Self-pay

## 2024-01-08 MED ORDER — SERTRALINE HCL 50 MG PO TABS
50.0000 mg | ORAL_TABLET | Freq: Every day | ORAL | 1 refills | Status: DC
  Filled 2024-01-08: qty 90, 90d supply, fill #0
  Filled 2024-04-10: qty 90, 90d supply, fill #1

## 2024-01-09 ENCOUNTER — Other Ambulatory Visit (HOSPITAL_BASED_OUTPATIENT_CLINIC_OR_DEPARTMENT_OTHER): Payer: Self-pay

## 2024-06-20 ENCOUNTER — Other Ambulatory Visit (HOSPITAL_BASED_OUTPATIENT_CLINIC_OR_DEPARTMENT_OTHER): Payer: Self-pay

## 2024-06-20 MED ORDER — DICLOFENAC SODIUM 1 % EX GEL
2.0000 g | Freq: Four times a day (QID) | CUTANEOUS | 1 refills | Status: AC
  Filled 2024-06-20 – 2024-08-12 (×2): qty 100, 13d supply, fill #0
  Filled 2024-10-02: qty 100, 13d supply, fill #1

## 2024-06-20 MED ORDER — MELOXICAM 7.5 MG PO TABS
ORAL_TABLET | ORAL | 0 refills | Status: AC
  Filled 2024-06-20: qty 60, 46d supply, fill #0

## 2024-06-30 ENCOUNTER — Ambulatory Visit (HOSPITAL_BASED_OUTPATIENT_CLINIC_OR_DEPARTMENT_OTHER)
Admission: RE | Admit: 2024-06-30 | Discharge: 2024-06-30 | Disposition: A | Discharge status: Home / Self Care | Source: Ambulatory Visit | Attending: Family Medicine | Admitting: Family Medicine

## 2024-06-30 DIAGNOSIS — M85852 Other specified disorders of bone density and structure, left thigh: Secondary | ICD-10-CM | POA: Diagnosis present

## 2024-07-04 ENCOUNTER — Other Ambulatory Visit (HOSPITAL_BASED_OUTPATIENT_CLINIC_OR_DEPARTMENT_OTHER): Payer: Self-pay

## 2024-07-07 ENCOUNTER — Other Ambulatory Visit (HOSPITAL_BASED_OUTPATIENT_CLINIC_OR_DEPARTMENT_OTHER): Payer: Self-pay

## 2024-07-07 MED ORDER — SERTRALINE HCL 50 MG PO TABS
50.0000 mg | ORAL_TABLET | Freq: Every day | ORAL | 1 refills | Status: DC
  Filled 2024-07-07: qty 90, 90d supply, fill #0
  Filled 2024-10-06 (×2): qty 90, 90d supply, fill #1

## 2024-07-14 ENCOUNTER — Other Ambulatory Visit: Payer: Federal, State, Local not specified - PPO

## 2024-07-18 ENCOUNTER — Other Ambulatory Visit (HOSPITAL_BASED_OUTPATIENT_CLINIC_OR_DEPARTMENT_OTHER): Payer: Self-pay

## 2024-07-18 MED ORDER — TOBRAMYCIN-DEXAMETHASONE 0.3-0.1 % OP SUSP
1.0000 [drp] | Freq: Four times a day (QID) | OPHTHALMIC | 0 refills | Status: AC
  Filled 2024-07-18: qty 5, 5d supply, fill #0

## 2024-08-08 ENCOUNTER — Other Ambulatory Visit (HOSPITAL_BASED_OUTPATIENT_CLINIC_OR_DEPARTMENT_OTHER): Payer: Self-pay

## 2024-08-08 MED ORDER — MELOXICAM 7.5 MG PO TABS
7.5000 mg | ORAL_TABLET | Freq: Every day | ORAL | 0 refills | Status: AC | PRN
  Filled 2024-08-08: qty 30, 30d supply, fill #0

## 2024-08-12 ENCOUNTER — Other Ambulatory Visit (HOSPITAL_BASED_OUTPATIENT_CLINIC_OR_DEPARTMENT_OTHER): Payer: Self-pay

## 2024-10-02 ENCOUNTER — Other Ambulatory Visit (HOSPITAL_BASED_OUTPATIENT_CLINIC_OR_DEPARTMENT_OTHER): Payer: Self-pay

## 2024-10-06 ENCOUNTER — Other Ambulatory Visit (HOSPITAL_BASED_OUTPATIENT_CLINIC_OR_DEPARTMENT_OTHER): Payer: Self-pay

## 2024-10-06 ENCOUNTER — Other Ambulatory Visit: Payer: Self-pay

## 2024-12-01 ENCOUNTER — Other Ambulatory Visit (HOSPITAL_BASED_OUTPATIENT_CLINIC_OR_DEPARTMENT_OTHER): Payer: Self-pay

## 2024-12-01 MED ORDER — AMOXICILLIN 875 MG PO TABS
875.0000 mg | ORAL_TABLET | Freq: Two times a day (BID) | ORAL | 0 refills | Status: AC
  Filled 2024-12-01: qty 20, 10d supply, fill #0

## 2024-12-01 MED ORDER — PROMETHAZINE-DM 6.25-15 MG/5ML PO SYRP
5.0000 mL | ORAL_SOLUTION | Freq: Four times a day (QID) | ORAL | 0 refills | Status: AC
  Filled 2024-12-01: qty 120, 6d supply, fill #0

## 2024-12-01 MED ORDER — SERTRALINE HCL 50 MG PO TABS
50.0000 mg | ORAL_TABLET | Freq: Every day | ORAL | 3 refills | Status: AC
  Filled 2024-12-01 – 2025-01-02 (×3): qty 90, 90d supply, fill #0

## 2025-01-02 ENCOUNTER — Other Ambulatory Visit: Payer: Self-pay

## 2025-01-02 ENCOUNTER — Other Ambulatory Visit (HOSPITAL_BASED_OUTPATIENT_CLINIC_OR_DEPARTMENT_OTHER): Payer: Self-pay
